# Patient Record
Sex: Male | Born: 1975 | Race: Black or African American | Hispanic: No | Marital: Single | Smoking: Former smoker
Health system: Southern US, Community
[De-identification: ages and names within clinical notes are randomized; demographics above are authoritative.]

## PROBLEM LIST (undated history)

## (undated) DIAGNOSIS — I10 Essential (primary) hypertension: Secondary | ICD-10-CM

---

## 1998-10-01 ENCOUNTER — Emergency Department (HOSPITAL_COMMUNITY): Admission: EM | Admit: 1998-10-01 | Discharge: 1998-10-01 | Payer: Self-pay | Admitting: Internal Medicine

## 1998-12-09 ENCOUNTER — Emergency Department (HOSPITAL_COMMUNITY): Admission: EM | Admit: 1998-12-09 | Discharge: 1998-12-09 | Payer: Self-pay | Admitting: *Deleted

## 1999-11-12 ENCOUNTER — Emergency Department (HOSPITAL_COMMUNITY): Admission: EM | Admit: 1999-11-12 | Discharge: 1999-11-12 | Payer: Self-pay | Admitting: Emergency Medicine

## 1999-11-23 ENCOUNTER — Encounter: Payer: Self-pay | Admitting: Emergency Medicine

## 1999-11-23 ENCOUNTER — Emergency Department (HOSPITAL_COMMUNITY): Admission: EM | Admit: 1999-11-23 | Discharge: 1999-11-23 | Payer: Self-pay | Admitting: Emergency Medicine

## 2001-01-10 ENCOUNTER — Emergency Department (HOSPITAL_COMMUNITY): Admission: EM | Admit: 2001-01-10 | Discharge: 2001-01-10 | Payer: Self-pay | Admitting: Emergency Medicine

## 2002-01-15 ENCOUNTER — Emergency Department (HOSPITAL_COMMUNITY): Admission: EM | Admit: 2002-01-15 | Discharge: 2002-01-15 | Payer: Self-pay | Admitting: Emergency Medicine

## 2002-07-13 ENCOUNTER — Emergency Department (HOSPITAL_COMMUNITY): Admission: EM | Admit: 2002-07-13 | Discharge: 2002-07-13 | Payer: Self-pay | Admitting: *Deleted

## 2002-07-13 ENCOUNTER — Encounter: Payer: Self-pay | Admitting: *Deleted

## 2003-01-20 ENCOUNTER — Emergency Department (HOSPITAL_COMMUNITY): Admission: EM | Admit: 2003-01-20 | Discharge: 2003-01-20 | Payer: Self-pay | Admitting: Emergency Medicine

## 2004-01-03 ENCOUNTER — Emergency Department (HOSPITAL_COMMUNITY): Admission: EM | Admit: 2004-01-03 | Discharge: 2004-01-03 | Payer: Self-pay | Admitting: Family Medicine

## 2004-07-30 ENCOUNTER — Emergency Department (HOSPITAL_COMMUNITY): Admission: EM | Admit: 2004-07-30 | Discharge: 2004-07-30 | Payer: Self-pay | Admitting: Emergency Medicine

## 2004-08-17 ENCOUNTER — Emergency Department (HOSPITAL_COMMUNITY): Admission: EM | Admit: 2004-08-17 | Discharge: 2004-08-17 | Payer: Self-pay | Admitting: Emergency Medicine

## 2005-07-16 ENCOUNTER — Emergency Department (HOSPITAL_COMMUNITY): Admission: EM | Admit: 2005-07-16 | Discharge: 2005-07-16 | Payer: Self-pay | Admitting: Family Medicine

## 2005-08-10 ENCOUNTER — Emergency Department (HOSPITAL_COMMUNITY): Admission: EM | Admit: 2005-08-10 | Discharge: 2005-08-10 | Payer: Self-pay | Admitting: Family Medicine

## 2009-06-01 ENCOUNTER — Emergency Department (HOSPITAL_COMMUNITY): Admission: EM | Admit: 2009-06-01 | Discharge: 2009-06-01 | Payer: Self-pay | Admitting: Emergency Medicine

## 2010-01-13 ENCOUNTER — Emergency Department (HOSPITAL_COMMUNITY)
Admission: EM | Admit: 2010-01-13 | Discharge: 2010-01-13 | Payer: Self-pay | Source: Home / Self Care | Admitting: Family Medicine

## 2010-04-14 LAB — URINALYSIS, ROUTINE W REFLEX MICROSCOPIC
Bilirubin Urine: NEGATIVE
Glucose, UA: NEGATIVE mg/dL
Hgb urine dipstick: NEGATIVE
Ketones, ur: NEGATIVE mg/dL
Nitrite: NEGATIVE
Protein, ur: NEGATIVE mg/dL
Specific Gravity, Urine: 1.026 (ref 1.005–1.030)
Urobilinogen, UA: 0.2 mg/dL (ref 0.0–1.0)
pH: 6 (ref 5.0–8.0)

## 2010-04-14 LAB — URINE MICROSCOPIC-ADD ON

## 2011-11-08 ENCOUNTER — Encounter (HOSPITAL_COMMUNITY): Payer: Self-pay | Admitting: Emergency Medicine

## 2011-11-08 ENCOUNTER — Emergency Department (INDEPENDENT_AMBULATORY_CARE_PROVIDER_SITE_OTHER)
Admission: EM | Admit: 2011-11-08 | Discharge: 2011-11-08 | Disposition: A | Discharge status: Home / Self Care | Payer: Self-pay | Source: Home / Self Care | Attending: Emergency Medicine | Admitting: Emergency Medicine

## 2011-11-08 DIAGNOSIS — H6120 Impacted cerumen, unspecified ear: Secondary | ICD-10-CM

## 2011-11-08 DIAGNOSIS — N342 Other urethritis: Secondary | ICD-10-CM

## 2011-11-08 LAB — HIV ANTIBODY (ROUTINE TESTING W REFLEX): HIV: NONREACTIVE

## 2011-11-08 LAB — RPR: RPR Ser Ql: NONREACTIVE

## 2011-11-08 MED ORDER — LIDOCAINE HCL (PF) 1 % IJ SOLN
INTRAMUSCULAR | Status: AC
  Filled 2011-11-08: qty 5

## 2011-11-08 MED ORDER — AZITHROMYCIN 250 MG PO TABS
ORAL_TABLET | ORAL | Status: AC
  Filled 2011-11-08: qty 4

## 2011-11-08 MED ORDER — CEFTRIAXONE SODIUM 250 MG IJ SOLR
250.0000 mg | Freq: Once | INTRAMUSCULAR | Status: AC
  Administered 2011-11-08: 250 mg via INTRAMUSCULAR

## 2011-11-08 MED ORDER — AZITHROMYCIN 250 MG PO TABS
1000.0000 mg | ORAL_TABLET | Freq: Once | ORAL | Status: AC
  Administered 2011-11-08: 1000 mg via ORAL

## 2011-11-08 MED ORDER — CEFTRIAXONE SODIUM 1 G IJ SOLR
INTRAMUSCULAR | Status: AC
  Filled 2011-11-08: qty 10

## 2011-11-08 NOTE — ED Notes (Signed)
Pt c/o pain on right ear since January... [place]... Sx include: decrease hearing, constant ring, clogged up... Denies: fevers, vomiting, nausea, diarrhea... Would also like to be checked for STD... C/o penis irritation, occasional clear drainage... Denies: blood, urinary problems, pain

## 2011-11-08 NOTE — ED Provider Notes (Signed)
Chief Complaint  Patient presents with  . Otalgia    History of Present Illness:   Martin Sloan is a 36 year old male who presents with 2 problems, right ear congestion and urethral irritation.  He's had problems with his ear since January. He notes a congested feeling, ringing, stopped up feeling and sometimes some pain. This is worse when he gets water in the ear or when he went swimming in the ocean. He denies any drainage from the ear and has also had some cough, congestion, and rhinorrhea. He denies any history of allergies.  He also complains of a one-week history of irritation to the tip of the urethra and a clear discharge. He denies dysuria, lesions of the penis, or adenopathy. He's had no fever, chills, skin rash, or joint pain. He does have a history of Chlamydia in the past.  Review of Systems:  Other than noted above, the patient denies any of the following symptoms: Systemic:  No fevers, chills, sweats, weight loss or gain, fatigue, or tiredness. Eye:  No redness, pain, discharge, itching, blurred vision, or diplopia. ENT:  No headache, nasal congestion, sneezing, itching, epistaxis, ear pain, congestion, decreased hearing, ringing in ears, vertigo, or tinnitus.  No oral lesions, sore throat, pain on swallowing, or hoarseness. Neck:  No mass, tenderness or adenopathy. Lungs:  No coughing, wheezing, or shortness of breath. Skin:  No rash or itching.  PMFSH:  Past medical history, family history, social history, meds, and allergies were reviewed.  Physical Exam:   Vital signs:  BP 140/75  Pulse 72  Temp 98.4 F (36.9 C) (Oral)  Resp 18  SpO2 100% General:  Alert and oriented.  In no distress.  Skin warm and dry. Eye:  PERRL, full EOMs, lids and conjunctiva normal.   ENT:  There were large cerumen impactions in both ear canals.  Nasal mucosa not congested and without drainage.  Mucous membranes moist, no oral lesions, normal dentition, pharynx clear.  No cranial or facial pain to  palplation. Neck:  Supple, full ROM.  No adenopathy, tenderness or mass.  Thyroid normal. Lungs:  Breath sounds clear and equal bilaterally.  No wheezes, rales or rhonchi. Heart:  Rhythm regular, without extrasystoles.  No gallops or murmers. Skin:  Clear, warm and dry. Genital exam: Was completely normal. There was no urethral discharge. No lesions on the penis or scrotum. Testes are normal. No inguinal lymphadenopathy.  Other Labs Obtained at Urgent Care Center:  A urethral swab was obtained for GC and Chlamydia DNA probe. Also obtained were serologies for HIV and RPR.  Results are pending at this time and we will call about any positive results.  Course in Urgent Care Center:   His ears were irrigated clear. A large amount of cerumen was obtained from both ears. Thereafter the canals and TMs are normal and he was able to hear again.  He was given Rocephin 250 mg IM followed by azithromycin 1000 mg by mouth and tolerated this well without any immediate side effects.  Assessment:  The primary encounter diagnosis was Impacted cerumen. A diagnosis of Urethritis was also pertinent to this visit.  Plan:   1.  The following meds were prescribed:   New Prescriptions   No medications on file   2.  The patient was instructed in symptomatic care and handouts were given. 3.  The patient was told to return if becoming worse in any way, if no better in 3 or 4 days, and given some red flag symptoms  that would indicate earlier return.     Reuben Likes, MD 11/08/11 [phone]

## 2011-11-09 LAB — GC/CHLAMYDIA PROBE AMP, GENITAL: GC Probe Amp, Genital: NEGATIVE

## 2011-11-12 ENCOUNTER — Emergency Department (HOSPITAL_COMMUNITY): Payer: Self-pay

## 2011-11-12 ENCOUNTER — Encounter (HOSPITAL_COMMUNITY): Payer: Self-pay | Admitting: *Deleted

## 2011-11-12 ENCOUNTER — Emergency Department (HOSPITAL_COMMUNITY)
Admission: EM | Admit: 2011-11-12 | Discharge: 2011-11-12 | Disposition: A | Discharge status: Home / Self Care | Payer: Self-pay | Attending: Emergency Medicine | Admitting: Emergency Medicine

## 2011-11-12 DIAGNOSIS — K5289 Other specified noninfective gastroenteritis and colitis: Secondary | ICD-10-CM | POA: Insufficient documentation

## 2011-11-12 DIAGNOSIS — R112 Nausea with vomiting, unspecified: Secondary | ICD-10-CM

## 2011-11-12 DIAGNOSIS — K529 Noninfective gastroenteritis and colitis, unspecified: Secondary | ICD-10-CM

## 2011-11-12 LAB — CBC WITH DIFFERENTIAL/PLATELET
Eosinophils Relative: 0 % (ref 0–5)
HCT: 43.3 % (ref 39.0–52.0)
Hemoglobin: 15.5 g/dL (ref 13.0–17.0)
Lymphocytes Relative: 12 % (ref 12–46)
Lymphs Abs: 1.9 10*3/uL (ref 0.7–4.0)
MCH: 28.2 pg (ref 26.0–34.0)
MCV: 78.7 fL (ref 78.0–100.0)
Monocytes Absolute: 0.8 10*3/uL (ref 0.1–1.0)
Monocytes Relative: 5 % (ref 3–12)
Platelets: 175 10*3/uL (ref 150–400)
RBC: 5.5 MIL/uL (ref 4.22–5.81)
WBC: 15.2 10*3/uL — ABNORMAL HIGH (ref 4.0–10.5)

## 2011-11-12 LAB — URINALYSIS, ROUTINE W REFLEX MICROSCOPIC
Glucose, UA: NEGATIVE mg/dL
Hgb urine dipstick: NEGATIVE
Ketones, ur: >80 mg/dL — AB
Leukocytes, UA: NEGATIVE
pH: 6.5 (ref 5.0–8.0)

## 2011-11-12 LAB — COMPREHENSIVE METABOLIC PANEL
AST: 62 U/L — ABNORMAL HIGH (ref 0–37)
Albumin: 5 g/dL (ref 3.5–5.2)
BUN: 14 mg/dL (ref 6–23)
Calcium: 10.6 mg/dL — ABNORMAL HIGH (ref 8.4–10.5)
Creatinine, Ser: 0.84 mg/dL (ref 0.50–1.35)
Total Protein: 8.4 g/dL — ABNORMAL HIGH (ref 6.0–8.3)

## 2011-11-12 LAB — RAPID URINE DRUG SCREEN, HOSP PERFORMED
Amphetamines: POSITIVE — AB
Benzodiazepines: NOT DETECTED
Tetrahydrocannabinol: POSITIVE — AB

## 2011-11-12 LAB — LIPASE, BLOOD: Lipase: 14 U/L (ref 11–59)

## 2011-11-12 MED ORDER — ONDANSETRON HCL 4 MG/2ML IJ SOLN
4.0000 mg | Freq: Once | INTRAMUSCULAR | Status: DC
  Filled 2011-11-12: qty 2

## 2011-11-12 MED ORDER — SODIUM CHLORIDE 0.9 % IV BOLUS (SEPSIS)
1000.0000 mL | Freq: Once | INTRAVENOUS | Status: AC
  Administered 2011-11-12: 1000 mL via INTRAVENOUS

## 2011-11-12 MED ORDER — ONDANSETRON 4 MG PO TBDP: 4.0000 mg | ORAL_TABLET | Freq: Three times a day (TID) | ORAL | Status: DC | PRN

## 2011-11-12 MED ORDER — IOHEXOL 300 MG/ML  SOLN
100.0000 mL | Freq: Once | INTRAMUSCULAR | Status: AC | PRN
  Administered 2011-11-12: 100 mL via INTRAVENOUS

## 2011-11-12 MED ORDER — PROMETHAZINE HCL 25 MG/ML IJ SOLN
25.0000 mg | INTRAMUSCULAR | Status: DC | PRN
  Administered 2011-11-12: 25 mg via INTRAVENOUS
  Filled 2011-11-12: qty 1

## 2011-11-12 NOTE — ED Notes (Addendum)
Per pt father, states pt took 40mg  of mother's prescription pantoprazole and drank an ensure prior to ED arrival

## 2011-11-12 NOTE — ED Notes (Signed)
Pt states nausea has eased up after taking phenergan. Pt denies pain at this time.

## 2011-11-12 NOTE — ED Notes (Signed)
D/C instructions reviewed. Rx for Zofran given. All questions answered.

## 2011-11-12 NOTE — ED Provider Notes (Signed)
History     CSN: [id number]  Arrival date & time 11/12/11  1550   First MD Initiated Contact with Patient 11/12/11 1630      Chief Complaint  Patient presents with  . Nausea     HPI Pt comes in from home, states that he had drank Ensure and felt nauseous and stated that he vomited 4-5 times since 12 noon. Pt also c/o dizziness and states that he just feels weird.  History reviewed. No pertinent past medical history.  History reviewed. No pertinent past surgical history.  History reviewed. No pertinent family history.  History  Substance Use Topics  . Smoking status: Never Smoker   . Smokeless tobacco: Not on file  . Alcohol Use: Yes      Review of Systems All other review of systems are negative Allergies  Review of patient's allergies indicates no known allergies.  Home Medications   Current Outpatient Rx  Name Route Sig Dispense Refill  . IBUPROFEN 200 MG PO TABS Oral Take 400 mg by mouth every 6 (six) hours as needed. Pain      BP 151/80  Pulse 84  Temp 97.9 F (36.6 C) (Oral)  Resp 16  SpO2 100%  Physical Exam  Nursing note and vitals reviewed. Constitutional: He is oriented to person, place, and time. He appears well-developed and well-nourished. No distress.  HENT:  Head: Normocephalic and atraumatic.  Eyes: Pupils are equal, round, and reactive to light.  Neck: Normal range of motion.  Cardiovascular: Normal rate and intact distal pulses.   Pulmonary/Chest: No respiratory distress.  Abdominal: Normal appearance. He exhibits no distension. There is no tenderness. There is no rebound and no guarding.  Musculoskeletal: Normal range of motion.  Neurological: He is alert and oriented to person, place, and time. No cranial nerve deficit.  Skin: Skin is warm and dry. No rash noted.  Psychiatric: He has a normal mood and affect. His behavior is normal.    ED Course  Procedures (including critical care time)  Medications  ibuprofen (ADVIL,MOTRIN)  200 MG tablet (not administered)  promethazine (PHENERGAN) injection 25 mg (25 mg Intravenous Given 11/12/11 1650)  sodium chloride 0.9 % bolus 1,000 mL (1000 mL Intravenous New Bag/Given 11/12/11 1647)  iohexol (OMNIPAQUE) 300 MG/ML solution 100 mL (100 mL Intravenous Contrast Given 11/12/11 1754)    Labs Reviewed  COMPREHENSIVE METABOLIC PANEL - Abnormal; Notable for the following:    Potassium 3.4 (*)     Chloride 93 (*)     CO2 16 (*)     Calcium 10.6 (*)     Total Protein 8.4 (*)     AST 62 (*)     ALT 66 (*)     All other components within normal limits  CBC WITH DIFFERENTIAL - Abnormal; Notable for the following:    WBC 15.2 (*)     Neutrophils Relative 83 (*)     Neutro Abs 12.6 (*)     All other components within normal limits  URINALYSIS, ROUTINE W REFLEX MICROSCOPIC - Abnormal; Notable for the following:    APPearance CLOUDY (*)     Ketones, ur >80 (*)     All other components within normal limits  URINE RAPID DRUG SCREEN (HOSP PERFORMED) - Abnormal; Notable for the following:    Amphetamines POSITIVE (*)     Tetrahydrocannabinol POSITIVE (*)     All other components within normal limits  LIPASE, BLOOD   Ct Abdomen Pelvis W Contrast  11/12/2011  *  RADIOLOGY REPORT*  Clinical Data: Pain  CT ABDOMEN AND PELVIS WITH CONTRAST  Technique:  Multidetector CT imaging of the abdomen and pelvis was performed following the standard protocol during bolus administration of intravenous contrast.  Contrast: OMNIPAQUE IOHEXOL 300 MG/ML  SOLN  Comparison: None.  Findings:  Lung bases are clear.  No pericardial or pleural effusion.  There is no focal liver abnormality.  The gallbladder appears normal.  No biliary dilatation.  The pancreas is unremarkable.  The spleen is negative.  Both adrenal glands are normal.  The right kidney is normal.  The left kidney is normal.  Urinary bladder is unremarkable.  Prostate gland and seminal vesicles are unremarkable.  There are no enlarged lymph  nodes within the upper abdomen.  No pelvic or inguinal adenopathy identified.  There is no free fluid or abnormal fluid collections identified within the abdomen or pelvis.  The stomach and the small bowel loops appear normal.  Normal appearance of the colon.   There is prominence of the colonic wall vertically the left colon and sigmoid colon.  This likely reflects incomplete distention.  No secondary inflammatory changes noted. No abnormal areas of enhancement noted.  Review of the visualized osseous structures is unremarkable.  IMPRESSION:  1.  There is mild wall thickening involving the colon which likely reflects incomplete distention.  Correlate for any clinical signs or symptoms of colitis. 2.  No evidence for bowel obstruction nor other inflammatory changes within the abdomen or pelvis.   Original Report Authenticated By: Rosealee Albee, M.D.      1. Nausea and vomiting   2. Colitis       MDM  After treatment in the ED the patient feels back to baseline and wants to go home.        Nelia Shi, MD 11/12/11 [phone]

## 2011-11-12 NOTE — ED Notes (Signed)
Pt given Ginger Ale for PO challenge. 

## 2011-11-12 NOTE — ED Notes (Signed)
Pt comes in from home, states that he had drank Ensure and felt nauseous and stated that he vomited 4-5 times since 12 noon. Pt also c/o dizziness and states that he just feels weird.

## 2014-06-14 IMAGING — CT CT ABD-PELV W/ CM
1 of 2 series · 15 of 32 positions shown, 19 images · IV contrast (omnipaque)
Comparison: None.

CLINICAL DATA: Pain

CT ABDOMEN AND PELVIS WITH CONTRAST
TECHNIQUE: Multidetector CT imaging of the abdomen and pelvis was
performed following the standard protocol during bolus
administration of intravenous contrast.
Contrast: 100mL OMNIPAQUE IOHEXOL 300 MG/ML  SOLN

[Series 2: abd/pel with · axial · 0.77mm/px · z∈[+1160,+1565]mm · 15 of 91 slices shown, 19 images]
[im 5/91  soft-tissue]
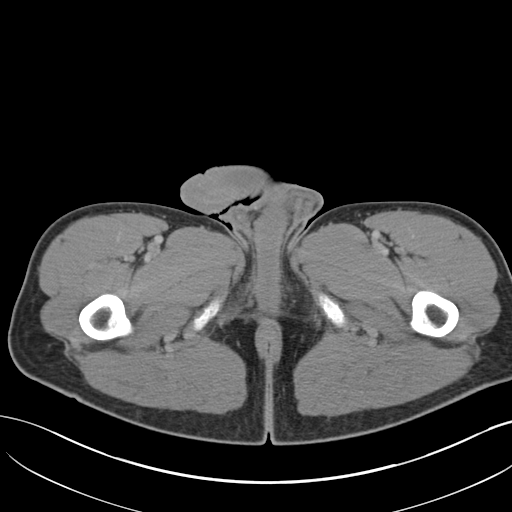
[im 5/91  bone]
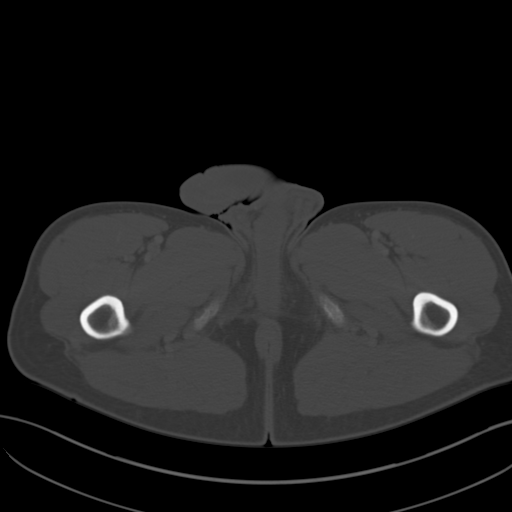
[im 13/91  soft-tissue]
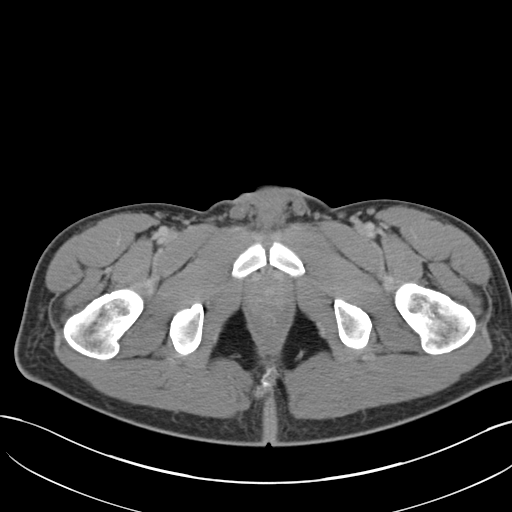
[im 21/91  soft-tissue]
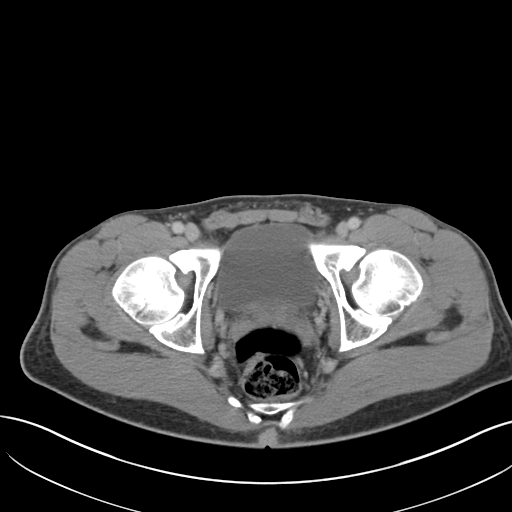
[im 25/91  soft-tissue]
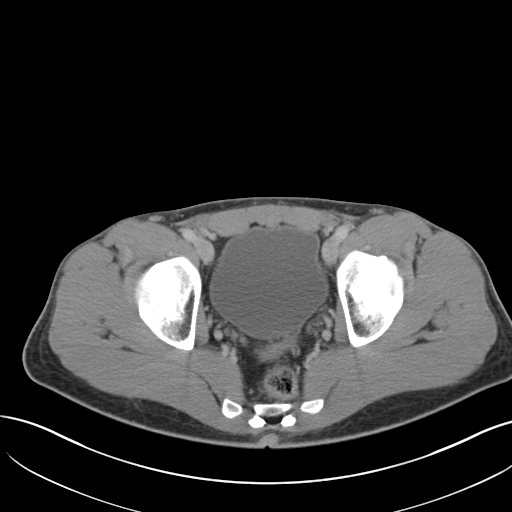
[im 33/91  soft-tissue]
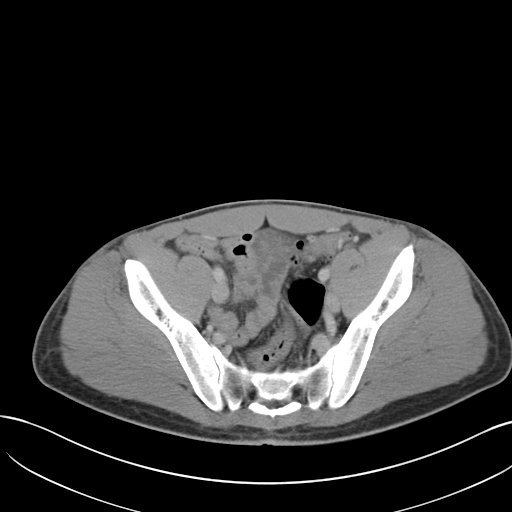
[im 37/91  soft-tissue]
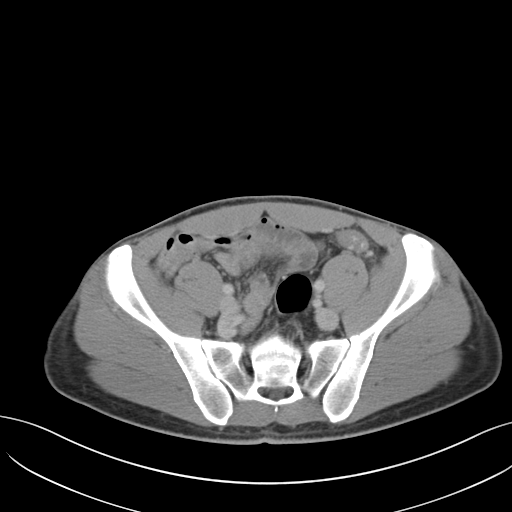
[im 46/91  soft-tissue]
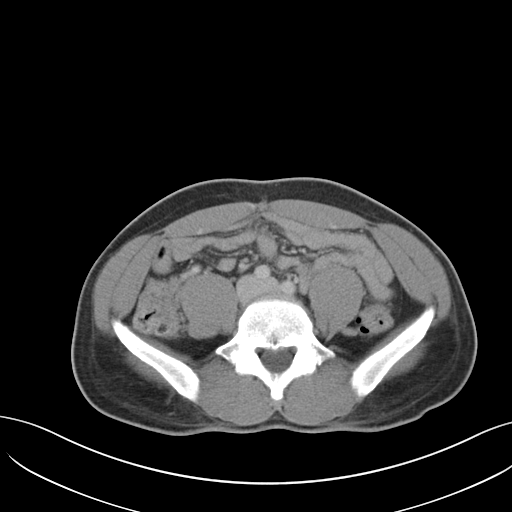
[im 54/91  soft-tissue]
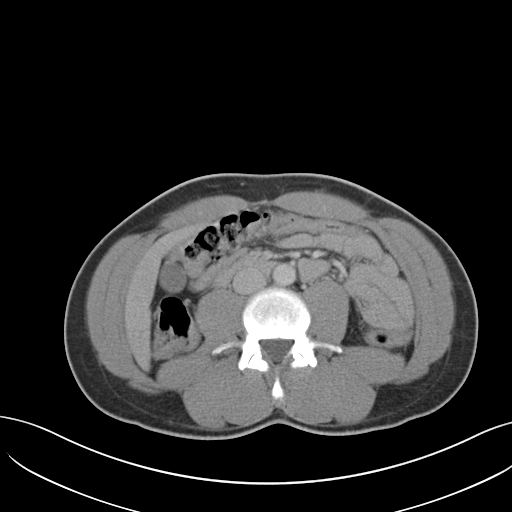
[im 58/91  soft-tissue]
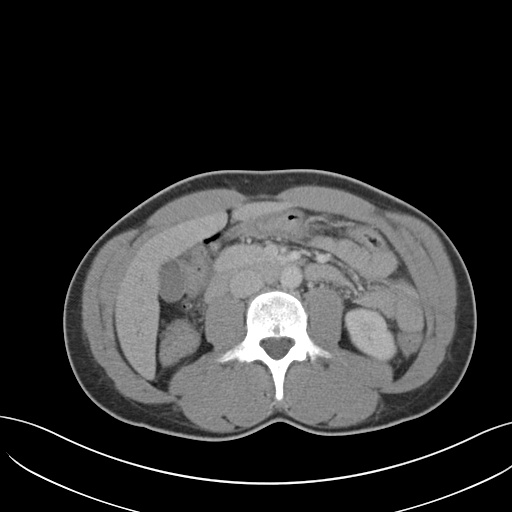
[im 58/91  bone]
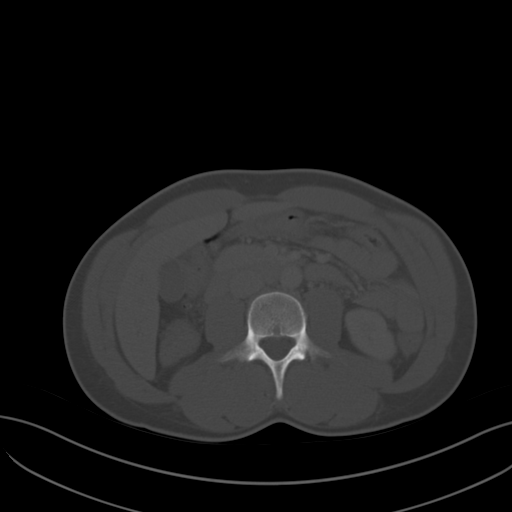
[im 66/91  soft-tissue]
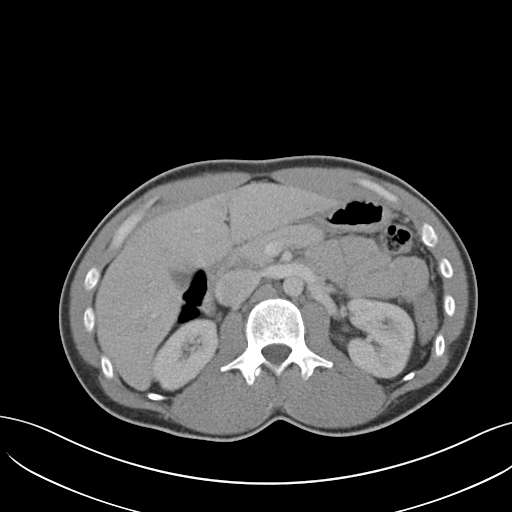
[im 70/91  soft-tissue]
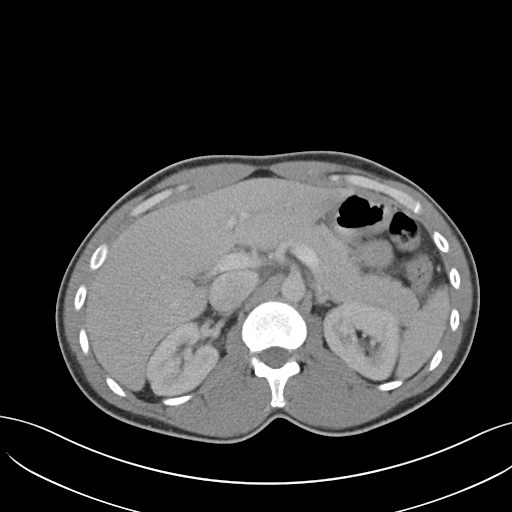
[im 74/91  lung]
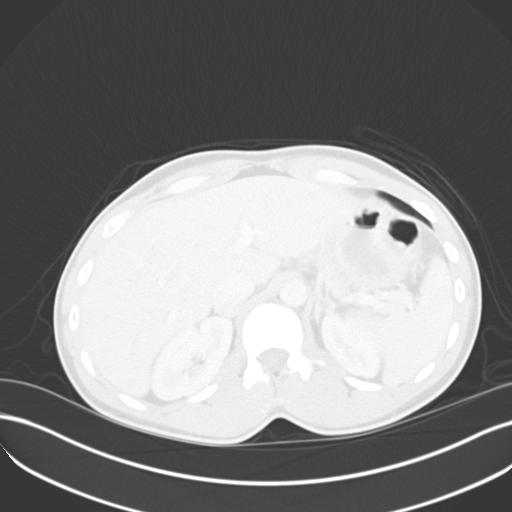
[im 78/91  soft-tissue]
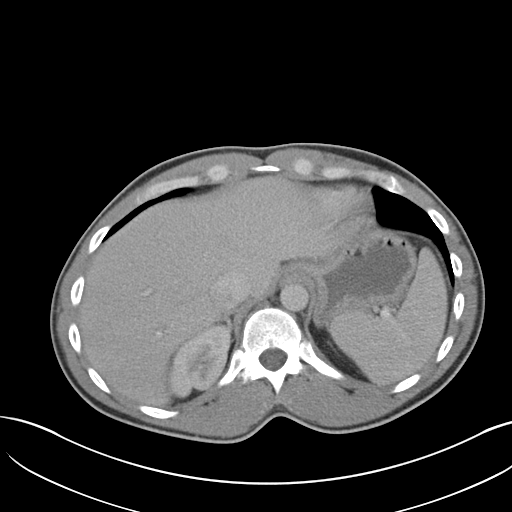
[im 78/91  lung]
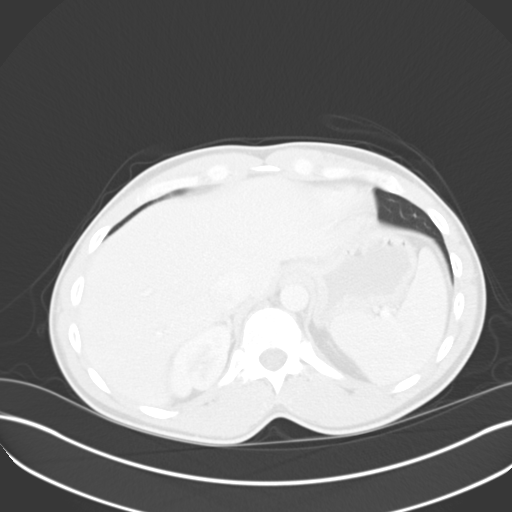
[im 82/91  lung]
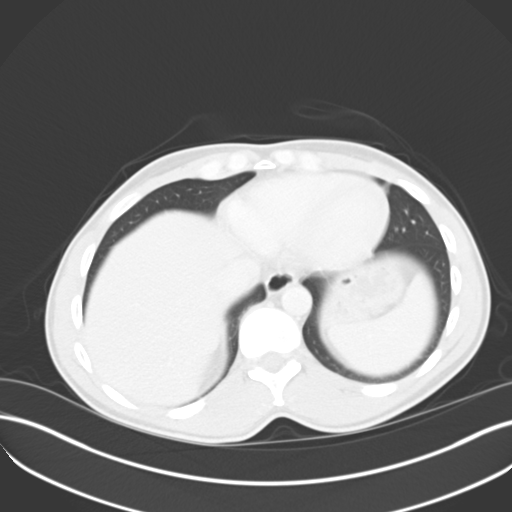
[im 86/91  soft-tissue]
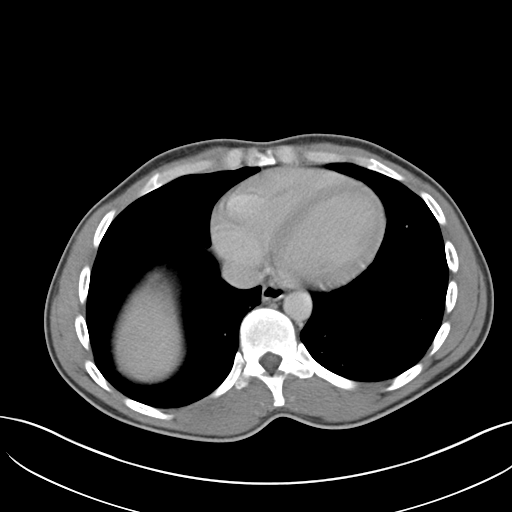
[im 86/91  lung]
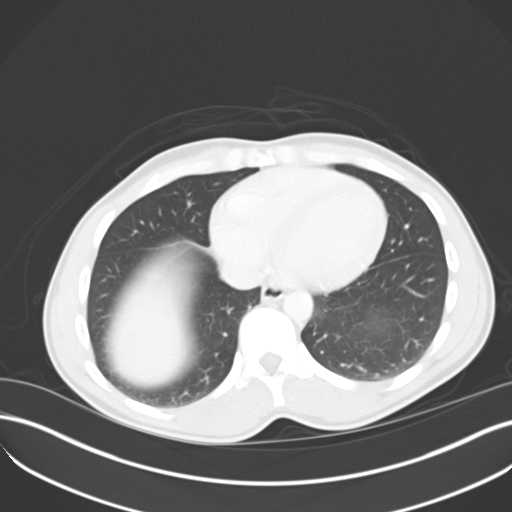

[15 of 32 positions shown; findings below may reference images not displayed]

FINDINGS: Lung bases are clear.  No pericardial or pleural effusion.  There
is no focal liver abnormality.  The gallbladder appears normal.  No
biliary dilatation.  The pancreas is unremarkable.

The spleen is negative.

Both adrenal glands are normal.  The right kidney is normal.  The
left kidney is normal.  Urinary bladder is unremarkable.  Prostate
gland and seminal vesicles are unremarkable.

There are no enlarged lymph nodes within the upper abdomen.  No
pelvic or inguinal adenopathy identified.  There is no free fluid
or abnormal fluid collections identified within the abdomen or
pelvis.

The stomach and the small bowel loops appear normal.  Normal
appearance of the colon.   There is prominence of the colonic wall
vertically the left colon and sigmoid colon.  This likely reflects
incomplete distention.  No secondary inflammatory changes noted.
No abnormal areas of enhancement noted.

Review of the visualized osseous structures is unremarkable.
IMPRESSION: 1.  There is mild wall thickening involving the colon which likely
reflects incomplete distention.  Correlate for any clinical signs
or symptoms of colitis.
2.  No evidence for bowel obstruction nor other inflammatory
changes within the abdomen or pelvis.

## 2015-03-20 ENCOUNTER — Other Ambulatory Visit (HOSPITAL_COMMUNITY)
Admission: RE | Admit: 2015-03-20 | Discharge: 2015-03-20 | Disposition: A | Discharge status: Home / Self Care | Payer: Medicaid Other | Source: Ambulatory Visit | Attending: Family Medicine | Admitting: Family Medicine

## 2015-03-20 ENCOUNTER — Emergency Department (HOSPITAL_COMMUNITY)
Admission: EM | Admit: 2015-03-20 | Discharge: 2015-03-20 | Disposition: A | Discharge status: Home / Self Care | Payer: Medicaid Other | Source: Home / Self Care | Attending: Family Medicine | Admitting: Family Medicine

## 2015-03-20 ENCOUNTER — Encounter (HOSPITAL_COMMUNITY): Payer: Self-pay | Admitting: *Deleted

## 2015-03-20 DIAGNOSIS — A64 Unspecified sexually transmitted disease: Secondary | ICD-10-CM | POA: Diagnosis not present

## 2015-03-20 DIAGNOSIS — Z113 Encounter for screening for infections with a predominantly sexual mode of transmission: Secondary | ICD-10-CM | POA: Insufficient documentation

## 2015-03-20 MED ORDER — AZITHROMYCIN 250 MG PO TABS
1000.0000 mg | ORAL_TABLET | Freq: Once | ORAL | Status: AC
  Administered 2015-03-20: 1000 mg via ORAL

## 2015-03-20 MED ORDER — CEFTRIAXONE SODIUM 250 MG IJ SOLR
250.0000 mg | Freq: Once | INTRAMUSCULAR | Status: AC
  Administered 2015-03-20: 250 mg via INTRAMUSCULAR

## 2015-03-20 MED ORDER — CEFTRIAXONE SODIUM 1 G IJ SOLR
INTRAMUSCULAR | Status: AC
  Filled 2015-03-20: qty 10

## 2015-03-20 MED ORDER — AZITHROMYCIN 250 MG PO TABS
ORAL_TABLET | ORAL | Status: AC
  Filled 2015-03-20: qty 4

## 2015-03-20 NOTE — ED Provider Notes (Signed)
CSN: [id number]     Arrival date & time 03/20/15  1912 History   First MD Initiated Contact with Patient 03/20/15 1954     Chief Complaint  Patient presents with  . Penile Discharge   (Consider location/radiation/quality/duration/timing/severity/associated sxs/prior Treatment) Patient is a 40 y.o. male presenting with penile discharge. The history is provided by the patient.  Penile Discharge This is a new problem. The current episode started yesterday (unprotected sex 6days ago , d/c noted yest). The problem has been gradually worsening. Associated symptoms comments: Penile d/c and soreness.Marland Kitchen    History reviewed. No pertinent past medical history. History reviewed. No pertinent past surgical history. History reviewed. No pertinent family history. Social History  Substance Use Topics  . Smoking status: Never Smoker   . Smokeless tobacco: None  . Alcohol Use: Yes    Review of Systems  Constitutional: Negative.   Genitourinary: Positive for discharge and penile pain. Negative for dysuria, flank pain, penile swelling, scrotal swelling, genital sores and testicular pain.  All other systems reviewed and are negative.   Allergies  Review of patient's allergies indicates no known allergies.  Home Medications   Prior to Admission medications   Medication Sig Start Date End Date Taking? Authorizing Provider  ibuprofen (ADVIL,MOTRIN) 200 MG tablet Take 400 mg by mouth every 6 (six) hours as needed. Pain    Historical Provider, MD  ondansetron (ZOFRAN ODT) 4 MG disintegrating tablet Take 1 tablet (4 mg total) by mouth every 8 (eight) hours as needed for nausea. 11/12/11   Nelva Nay, MD   Meds Ordered and Administered this Visit   Medications  cefTRIAXone (ROCEPHIN) injection 250 mg (not administered)  azithromycin (ZITHROMAX) tablet 1,000 mg (not administered)    BP 149/98 mmHg  Pulse 83  Temp(Src) 98.7 F (37.1 C) (Oral)  Resp 16  SpO2 98% No data found.   Physical  Exam  Constitutional: He is oriented to person, place, and time. He appears well-developed and well-nourished. No distress.  Abdominal: Soft. Bowel sounds are normal.  Genitourinary: Testes normal. Right testis shows no tenderness. Left testis shows no tenderness. Circumcised. Penile tenderness present. Discharge found.  Lymphadenopathy:       Right: No inguinal adenopathy present.       Left: No inguinal adenopathy present.  Neurological: He is alert and oriented to person, place, and time.  Skin: Skin is warm and dry.  Nursing note and vitals reviewed.   ED Course  Procedures (including critical care time)  Labs Review Labs Reviewed  CYTOLOGY, (ORAL, ANAL, URETHRAL) ANCILLARY ONLY    Imaging Review No results found.   Visual Acuity Review  Right Eye Distance:   Left Eye Distance:   Bilateral Distance:    Right Eye Near:   Left Eye Near:    Bilateral Near:         MDM   1. STD (male)     Meds ordered this encounter  Medications  . cefTRIAXone (ROCEPHIN) injection 250 mg    Sig:   . azithromycin (ZITHROMAX) tablet 1,000 mg    Sig:       Linna Hoff, MD 03/20/15 2024

## 2015-03-20 NOTE — Discharge Instructions (Signed)
We will call with positive test results and treat as indicated  °

## 2015-03-20 NOTE — ED Notes (Signed)
Pt  Reports  Symptoms  Of  Penile   Discharge      With  Discomfort     Pt   Reports   The  Symptoms  X  2  Days

## 2015-03-21 LAB — CYTOLOGY, (ORAL, ANAL, URETHRAL) ANCILLARY ONLY
Chlamydia: NEGATIVE
Neisseria Gonorrhea: POSITIVE — AB

## 2015-03-24 ENCOUNTER — Telehealth (HOSPITAL_COMMUNITY): Payer: Self-pay | Admitting: Emergency Medicine

## 2015-03-24 NOTE — ED Notes (Signed)
Pt called back... notified of recent lab results from visit 2/23 Pt ID'd properly... Reports feeling better and sx have subsided  Per Dr. Dayton Scrape,  Please let patient know that test for gonorrhea was positive. Treated at [facility] visit 03/20/15 with rocephin 250 mg IM and zithromax po 1g. No further treatment needed; recheck for persistent symptoms.   Please notify patient and health department. LM  Adv pt if sx are not getting better to return  Education on safe sex given Pt verb understanding.

## 2015-03-24 NOTE — ED Notes (Signed)
x1 Attempt  Unable to LM due to VM not being set up Need to give lab results from recent visit on 2/23  Per Dr. Dayton Scrape,  Please let patient know that test for gonorrhea was positive. Treated at [facility] visit 03/20/15 with rocephin 250 mg IM and zithromax po 1g. No further treatment needed; recheck for persistent symptoms.   Please notify patient and health department. LM   Will try later.  Faxed documentation to [facility]

## 2017-02-05 ENCOUNTER — Other Ambulatory Visit: Payer: Self-pay

## 2017-02-05 ENCOUNTER — Encounter (HOSPITAL_COMMUNITY): Payer: Self-pay | Admitting: *Deleted

## 2017-02-05 ENCOUNTER — Ambulatory Visit (HOSPITAL_COMMUNITY)
Admission: EM | Admit: 2017-02-05 | Discharge: 2017-02-05 | Disposition: A | Discharge status: Home / Self Care | Payer: Self-pay | Attending: Family Medicine | Admitting: Family Medicine

## 2017-02-05 DIAGNOSIS — R369 Urethral discharge, unspecified: Secondary | ICD-10-CM

## 2017-02-05 DIAGNOSIS — Z113 Encounter for screening for infections with a predominantly sexual mode of transmission: Secondary | ICD-10-CM

## 2017-02-05 DIAGNOSIS — Z202 Contact with and (suspected) exposure to infections with a predominantly sexual mode of transmission: Secondary | ICD-10-CM

## 2017-02-05 LAB — POCT URINALYSIS DIP (DEVICE)
BILIRUBIN URINE: NEGATIVE
GLUCOSE, UA: NEGATIVE mg/dL
Hgb urine dipstick: NEGATIVE
KETONES UR: NEGATIVE mg/dL
Leukocytes, UA: NEGATIVE
Nitrite: NEGATIVE
Protein, ur: NEGATIVE mg/dL
Specific Gravity, Urine: 1.025 (ref 1.005–1.030)
Urobilinogen, UA: 0.2 mg/dL (ref 0.0–1.0)
pH: 7 (ref 5.0–8.0)

## 2017-02-05 MED ORDER — AZITHROMYCIN 250 MG PO TABS
1000.0000 mg | ORAL_TABLET | Freq: Once | ORAL | Status: AC
Start: 2017-02-05 — End: 2017-02-05
  Administered 2017-02-05: 1000 mg via ORAL

## 2017-02-05 MED ORDER — LIDOCAINE HCL (PF) 1 % IJ SOLN
INTRAMUSCULAR | Status: AC
  Filled 2017-02-05: qty 2

## 2017-02-05 MED ORDER — AZITHROMYCIN 250 MG PO TABS
ORAL_TABLET | ORAL | Status: AC
  Filled 2017-02-05: qty 4

## 2017-02-05 MED ORDER — CEFTRIAXONE SODIUM 250 MG IJ SOLR
INTRAMUSCULAR | Status: AC
  Filled 2017-02-05: qty 250

## 2017-02-05 MED ORDER — CEFTRIAXONE SODIUM 250 MG IJ SOLR
250.0000 mg | Freq: Once | INTRAMUSCULAR | Status: AC
Start: 2017-02-05 — End: 2017-02-05
  Administered 2017-02-05: 250 mg via INTRAMUSCULAR

## 2017-02-05 NOTE — ED Triage Notes (Signed)
C/O penile discharge x 3 days.  Was told by partner she has chlamydia.

## 2017-02-05 NOTE — Discharge Instructions (Addendum)
We have treated you today for gonorrhea and chlamydia, with rocephin and azithromycin. Please refrain from sexual activity for 7 days while medicine is clearing infection. ° °We are testing you for Gonorrhea, Chlamydia and Trichomonas. We will call you if anything is positive and let you know if you require any further treatment. Please inform partner of any positive results. ° °Please return if symptoms not improving with treatment, development of fever, nausea, vomiting, abdominal pain, scrotal pain. °

## 2017-02-05 NOTE — ED Provider Notes (Signed)
[facility]    CSN: [id number] Arrival date & time: 02/05/17  1201     History   Chief Complaint Chief Complaint  Patient presents with  . Penile Discharge    HPI YEHONATAN GRANDISON is a 42 y.o. male presenting with penile discharge and concern over STD exposure. States his girlfriend cheated on him and tested positive for chlamydia. Symptoms occurring for a few days. Symptoms today include small amount of discharge and itching/irritation sensation on the inside of his urethra. Denies any pain, dysuria, rashes and lesions. Denies fever, nausea, vomiting, abdominal pain. Denies any scrotal/testicular swelling or pain.  HPI  History reviewed. No pertinent past medical history.  There are no active problems to display for this patient.   History reviewed. No pertinent surgical history.     Home Medications    Prior to Admission medications   Medication Sig Start Date End Date Taking? Authorizing Provider  ibuprofen (ADVIL,MOTRIN) 200 MG tablet Take 400 mg by mouth every 6 (six) hours as needed. Pain    [provider]  ondansetron (ZOFRAN ODT) 4 MG disintegrating tablet Take 1 tablet (4 mg total) by mouth every 8 (eight) hours as needed for nausea. 11/12/11   Nelva Nay, MD    Family History History reviewed. No pertinent family history.  Social History Social History   Tobacco Use  . Smoking status: Former Games developer  . Smokeless tobacco: Never Used  Substance Use Topics  . Alcohol use: No    Frequency: Never  . Drug use: No     Allergies   Patient has no known allergies.   Review of Systems Review of Systems  Constitutional: Negative for fever.  HENT: Negative for sore throat.   Respiratory: Negative for shortness of breath.   Cardiovascular: Negative for chest pain.  Gastrointestinal: Negative for abdominal pain, nausea and vomiting.  Genitourinary: Positive for discharge. Negative for difficulty urinating, dysuria, frequency, penile  pain, penile swelling, scrotal swelling and testicular pain.  Skin: Negative for rash.  Neurological: Negative for dizziness, light-headedness and headaches.     Physical Exam Triage Vital Signs ED Triage Vitals [02/05/17 1213]  Enc Vitals Group     BP (!) 145/83     Pulse Rate 66     Resp 14     Temp 98 F (36.7 C)     Temp Source Oral     SpO2 100 %     Weight      Height      Head Circumference      Peak Flow      Pain Score      Pain Loc      Pain Edu?      Excl. in GC?    No data found.  Updated Vital Signs BP (!) 145/83   Pulse 66   Temp 98 F (36.7 C) (Oral)   Resp 14   SpO2 100%    Physical Exam  Constitutional: He appears well-developed and well-nourished.  HENT:  Head: Normocephalic and atraumatic.  Eyes: Conjunctivae are normal.  Neck: Neck supple.  Cardiovascular: Normal rate and regular rhythm.  No murmur heard. Pulmonary/Chest: Effort normal and breath sounds normal. No respiratory distress.  Abdominal: Soft. There is no tenderness.  Genitourinary:  Genitourinary Comments: Exam deferred- patient denies rashes, lesions, pain  Musculoskeletal: He exhibits no edema.  Neurological: He is alert.  Skin: Skin is warm and dry.  Psychiatric: He has a normal mood and affect.  Nursing note  and vitals reviewed.    UC Treatments / Results  Labs (all labs ordered are listed, but only abnormal results are displayed) Labs Reviewed  URINE CULTURE  URINE CYTOLOGY ANCILLARY ONLY    EKG  EKG Interpretation None       Radiology No results found.  Procedures Procedures (including critical care time)  Medications Ordered in UC Medications - No data to display   Initial Impression / Assessment and Plan / UC Course  I have reviewed the triage vital signs and the nursing notes.  Pertinent labs & imaging results that were available during my care of the patient were reviewed by me and considered in my medical decision making (see chart for  details).     Patient with penile discharge. Screening for STD's performed, will call to inform of positive results. Patient requesting empiric treatment today. Treated with Rocephin and azithromycin.   Discussed strict return precautions. Patient verbalized understanding and is agreeable with plan.    Final Clinical Impressions(s) / UC Diagnoses   Final diagnoses:  None    ED Discharge Orders    None       Controlled Substance Prescriptions [place] Controlled Substance Registry consulted? Not Applicable   Lew DawesWieters, Hallie C, [place] 02/05/17 1232

## 2017-02-06 LAB — URINE CULTURE: Culture: NO GROWTH

## 2017-02-07 LAB — URINE CYTOLOGY ANCILLARY ONLY
Chlamydia: NEGATIVE
NEISSERIA GONORRHEA: NEGATIVE
Trichomonas: NEGATIVE

## 2018-06-29 ENCOUNTER — Encounter (HOSPITAL_COMMUNITY): Payer: Self-pay | Admitting: Emergency Medicine

## 2018-06-29 ENCOUNTER — Ambulatory Visit (HOSPITAL_COMMUNITY)
Admission: EM | Admit: 2018-06-29 | Discharge: 2018-06-29 | Disposition: A | Discharge status: Home / Self Care | Payer: Self-pay | Attending: Urgent Care | Admitting: Urgent Care

## 2018-06-29 ENCOUNTER — Other Ambulatory Visit: Payer: Self-pay

## 2018-06-29 DIAGNOSIS — R300 Dysuria: Secondary | ICD-10-CM | POA: Insufficient documentation

## 2018-06-29 DIAGNOSIS — R369 Urethral discharge, unspecified: Secondary | ICD-10-CM | POA: Insufficient documentation

## 2018-06-29 MED ORDER — AZITHROMYCIN 250 MG PO TABS
ORAL_TABLET | ORAL | Status: AC
  Filled 2018-06-29: qty 4

## 2018-06-29 MED ORDER — CEFTRIAXONE SODIUM 250 MG IJ SOLR
INTRAMUSCULAR | Status: AC
  Filled 2018-06-29: qty 250

## 2018-06-29 MED ORDER — CEFTRIAXONE SODIUM 250 MG IJ SOLR
250.0000 mg | Freq: Once | INTRAMUSCULAR | Status: AC
  Administered 2018-06-29: 16:00:00 250 mg via INTRAMUSCULAR

## 2018-06-29 MED ORDER — AZITHROMYCIN 250 MG PO TABS
1000.0000 mg | ORAL_TABLET | Freq: Once | ORAL | Status: AC
  Administered 2018-06-29: 1000 mg via ORAL

## 2018-06-29 NOTE — ED Triage Notes (Signed)
PT reports clear discharge and dysuria for 2 days.

## 2018-06-29 NOTE — ED Provider Notes (Signed)
  MRN: [id number] DOB: March 10, 1975  Subjective:   Martin Sloan is a 43 y.o. male presenting for 2-day history of clear penile discharge with mild to moderate persistent dysuria.  Patient had sex in the past week and unfortunately the condom broke.  He would like to get treated for STI and wants to have full panel.  No current facility-administered medications for this encounter.   Current Outpatient Medications:  .  ibuprofen (ADVIL,MOTRIN) 200 MG tablet, Take 400 mg by mouth every 6 (six) hours as needed. Pain, Disp: , Rfl:  .  ondansetron (ZOFRAN ODT) 4 MG disintegrating tablet, Take 1 tablet (4 mg total) by mouth every 8 (eight) hours as needed for nausea., Disp: 10 tablet, Rfl: 0   No Known Allergies  History reviewed. No pertinent past medical history.   History reviewed. No pertinent surgical history.  ROS  Objective:   Vitals: BP (!) 144/95 (BP Location: Left Arm)   Pulse 74   Temp 98.3 F (36.8 C) (Oral)   Resp 18   SpO2 96%   Physical Exam Constitutional:      Appearance: Normal appearance. He is well-developed and normal weight.  HENT:     Head: Normocephalic and atraumatic.     Right Ear: External ear normal.     Left Ear: External ear normal.     Nose: Nose normal.     Mouth/Throat:     Pharynx: Oropharynx is clear.  Eyes:     Extraocular Movements: Extraocular movements intact.     Pupils: Pupils are equal, round, and reactive to light.  Cardiovascular:     Rate and Rhythm: Normal rate.  Pulmonary:     Effort: Pulmonary effort is normal.  Neurological:     Mental Status: He is alert and oriented to person, place, and time.  Psychiatric:        Mood and Affect: Mood normal.        Behavior: Behavior normal.     Assessment and Plan :   Penile discharge  Dysuria  We will treat empirically per CDC guidelines for gonorrhea and chlamydia with IM ceftriaxone and azithromycin in clinic.  Counseled on safe sex practices. Counseled patient on potential  for adverse effects with medications prescribed/recommended today, ER and return-to-clinic precautions discussed, patient verbalized understanding.    Wallis Bamberg, [place] 06/29/18 1537

## 2018-06-30 LAB — URINE CYTOLOGY ANCILLARY ONLY
Chlamydia: NEGATIVE
Neisseria Gonorrhea: NEGATIVE
Trichomonas: NEGATIVE

## 2018-06-30 LAB — RPR: RPR Ser Ql: NONREACTIVE

## 2018-06-30 LAB — HIV ANTIBODY (ROUTINE TESTING W REFLEX): HIV Screen 4th Generation wRfx: NONREACTIVE

## 2018-06-30 LAB — URINE CULTURE: Culture: NO GROWTH

## 2019-05-05 ENCOUNTER — Ambulatory Visit: Payer: Medicaid Other | Attending: Internal Medicine

## 2019-05-05 DIAGNOSIS — Z23 Encounter for immunization: Secondary | ICD-10-CM

## 2019-05-05 NOTE — Progress Notes (Signed)
   Covid-19 Vaccination Clinic  Name:  [facility]    MRN: [id number] DOB: 1975/09/27  05/05/2019  Mr. Wence was observed post Covid-19 immunization for 15 minutes without incident. He was provided with Vaccine Information Sheet and instruction to access the V-Safe system.   Mr. Marquard was instructed to call 911 with any severe reactions post vaccine: Marland Kitchen Difficulty breathing  . Swelling of face and throat  . A fast heartbeat  . A bad rash all over body  . Dizziness and weakness   Immunizations Administered    Name Date Dose VIS Date Route   Moderna COVID-19 Vaccine 05/05/2019 12:21 PM 0.5 mL 12/26/2018 Intramuscular   Manufacturer: Moderna   Lot: [id number]   NDC: 96438-381-84

## 2019-06-02 ENCOUNTER — Ambulatory Visit: Payer: Medicaid Other | Attending: Internal Medicine

## 2019-06-02 DIAGNOSIS — Z23 Encounter for immunization: Secondary | ICD-10-CM

## 2019-06-02 NOTE — Progress Notes (Signed)
   Covid-19 Vaccination Clinic  Name:  BLAKELY GLUTH    MRN: [id number] DOB: [birth year]  06/02/2019  Mr. Hustead was observed post Covid-19 immunization for 15 minutes without incident. He was provided with Vaccine Information Sheet and instruction to access the V-Safe system.   Mr. Denker was instructed to call 911 with any severe reactions post vaccine: Marland Kitchen Difficulty breathing  . Swelling of face and throat  . A fast heartbeat  . A bad rash all over body  . Dizziness and weakness   Immunizations Administered    Name Date Dose VIS Date Route   Moderna COVID-19 Vaccine 06/02/2019 12:07 PM 0.5 mL 12/2018 Intramuscular   Manufacturer: Moderna   Lot: [id number]   NDC: 00459-977-41

## 2019-06-21 ENCOUNTER — Other Ambulatory Visit: Payer: Self-pay

## 2019-06-21 ENCOUNTER — Encounter (HOSPITAL_COMMUNITY): Payer: Self-pay | Admitting: Emergency Medicine

## 2019-06-21 ENCOUNTER — Ambulatory Visit (HOSPITAL_COMMUNITY)
Admission: EM | Admit: 2019-06-21 | Discharge: 2019-06-21 | Disposition: A | Discharge status: Home / Self Care | Payer: Medicaid Other | Attending: Physician Assistant | Admitting: Physician Assistant

## 2019-06-21 DIAGNOSIS — N342 Other urethritis: Secondary | ICD-10-CM | POA: Insufficient documentation

## 2019-06-21 MED ORDER — CEFTRIAXONE SODIUM 500 MG IJ SOLR
INTRAMUSCULAR | Status: AC
  Filled 2019-06-21: qty 500

## 2019-06-21 NOTE — ED Triage Notes (Signed)
Pt c/o penile discharge x 2 days ago and also c/o lower abd pain. Pt denies urinary symptoms or trouble with bowel movements. Patient denies any blood in urine. Pt states he was tested for STD before when he had similar symptoms and was negative is concerned and something else is wrong.

## 2019-06-21 NOTE — ED Provider Notes (Signed)
[facility]    CSN: [id number] Arrival date & time: 06/21/19  1105      History   Chief Complaint Chief Complaint  Patient presents with  . Abdominal Pain  . Penile Discharge    HPI Martin Sloan is a 44 y.o. male.   Patient reports for 2 days of penile discharge.  He also reports a tingling sensation that started a day prior to this.  He denies abdominal pain, testicular pain or swelling.  Denies fever or chills.  Denies significant painful urination.  Reports he had similar symptoms about a year ago however tested negative for disease.  Was treated with a shot and azithromycin at the time he says.  Reports symptoms resolved after that.  No intermittent symptoms since then.  Denies frequent urination or urgency.  Reports he is sexually active with one partner only.     History reviewed. No pertinent past medical history.  There are no problems to display for this patient.   History reviewed. No pertinent surgical history.     Home Medications    Prior to Admission medications   Medication Sig Start Date End Date Taking? Authorizing Provider  ibuprofen (ADVIL,MOTRIN) 200 MG tablet Take 400 mg by mouth every 6 (six) hours as needed. Pain    [provider]  ondansetron (ZOFRAN ODT) 4 MG disintegrating tablet Take 1 tablet (4 mg total) by mouth every 8 (eight) hours as needed for nausea. 11/12/11   Leonard Schwartz, MD    Family History History reviewed. No pertinent family history.  Social History Social History   Tobacco Use  . Smoking status: Former Research scientist (life sciences)  . Smokeless tobacco: Never Used  Substance Use Topics  . Alcohol use: No  . Drug use: No     Allergies   Patient has no known allergies.   Review of Systems Review of Systems   Physical Exam Triage Vital Signs ED Triage Vitals  Enc Vitals Group     BP 06/21/19 1133 132/80     Pulse Rate 06/21/19 1133 77     Resp 06/21/19 1133 14     Temp 06/21/19 1133 98.6 F (37 C)   Temp Source 06/21/19 1133 Oral     SpO2 06/21/19 1133 98 %     Weight --      Height --      Head Circumference --      Peak Flow --      Pain Score 06/21/19 1135 0     Pain Loc --      Pain Edu? --      Excl. in [place]? --    No data found.  Updated Vital Signs BP 132/80 (BP Location: Left Arm)   Pulse 77   Temp 98.6 F (37 C) (Oral)   Resp 14   SpO2 98%   Visual Acuity Right Eye Distance:   Left Eye Distance:   Bilateral Distance:    Right Eye Near:   Left Eye Near:    Bilateral Near:     Physical Exam Vitals and nursing note reviewed.  Constitutional:      Appearance: He is well-developed. He is not ill-appearing.  HENT:     Head: Normocephalic and atraumatic.  Eyes:     Conjunctiva/sclera: Conjunctivae normal.  Cardiovascular:     Rate and Rhythm: Normal rate and regular rhythm.     Heart sounds: No murmur.  Pulmonary:     Effort: Pulmonary effort is normal. No respiratory  distress.  Genitourinary:    Comments: No lesions in the genital region. No testicular swelling or pain Small amount of clear to white-colored discharge at the meatus. Musculoskeletal:     Cervical back: Neck supple.  Skin:    General: Skin is warm and dry.  Neurological:     Mental Status: He is alert.      UC Treatments / Results  Labs (all labs ordered are listed, but only abnormal results are displayed) Labs Reviewed  CYTOLOGY, (ORAL, ANAL, URETHRAL) ANCILLARY ONLY    EKG   Radiology No results found.  Procedures Procedures (including critical care time)  Medications Ordered in UC Medications - No data to display  Initial Impression / Assessment and Plan / UC Course  I have reviewed the triage vital signs and the nursing notes.  Pertinent labs & imaging results that were available during my care of the patient were reviewed by me and considered in my medical decision making (see chart for details).     #Urethritis Patient is a 44 year old symptoms consistent  with urethritis.  Patient was seen during downtime paper prescription for doxycycline sent.  Rocephin given in clinic.  Swab for gonorrhea, chlamydia, trichomonas obtained.  Discussed if all tests are negative again he should follow-up with urology for discussion of symptoms.  Patient verbalized understanding. Final Clinical Impressions(s) / UC Diagnoses   Final diagnoses:  Urethritis   Discharge Instructions   None    ED Prescriptions    None     PDMP not reviewed this encounter.   Hermelinda Medicus, PA-C 06/21/19 1425

## 2019-06-22 LAB — CYTOLOGY, (ORAL, ANAL, URETHRAL) ANCILLARY ONLY
Chlamydia: NEGATIVE
Comment: NEGATIVE
Comment: NEGATIVE
Comment: NORMAL
Neisseria Gonorrhea: NEGATIVE
Trichomonas: NEGATIVE

## 2020-06-05 ENCOUNTER — Encounter (HOSPITAL_COMMUNITY): Payer: Self-pay | Admitting: Emergency Medicine

## 2020-06-05 ENCOUNTER — Other Ambulatory Visit: Payer: Self-pay

## 2020-06-05 ENCOUNTER — Ambulatory Visit (HOSPITAL_COMMUNITY)
Admission: EM | Admit: 2020-06-05 | Discharge: 2020-06-05 | Disposition: A | Discharge status: Home / Self Care | Payer: Commercial Managed Care - PPO | Attending: Family | Admitting: Family

## 2020-06-05 DIAGNOSIS — R369 Urethral discharge, unspecified: Secondary | ICD-10-CM | POA: Diagnosis not present

## 2020-06-05 DIAGNOSIS — L293 Anogenital pruritus, unspecified: Secondary | ICD-10-CM

## 2020-06-05 DIAGNOSIS — N4889 Other specified disorders of penis: Secondary | ICD-10-CM | POA: Diagnosis not present

## 2020-06-05 MED ORDER — CEFTRIAXONE SODIUM 500 MG IJ SOLR
500.0000 mg | Freq: Once | INTRAMUSCULAR | Status: AC
  Administered 2020-06-05: 500 mg via INTRAMUSCULAR

## 2020-06-05 MED ORDER — DOXYCYCLINE HYCLATE 100 MG PO CAPS: 100.0000 mg | ORAL_CAPSULE | Freq: Two times a day (BID) | ORAL | 0 refills | Status: AC

## 2020-06-05 MED ORDER — LIDOCAINE HCL (PF) 1 % IJ SOLN
INTRAMUSCULAR | Status: AC
  Filled 2020-06-05: qty 2

## 2020-06-05 MED ORDER — CEFTRIAXONE SODIUM 500 MG IJ SOLR
INTRAMUSCULAR | Status: AC
  Filled 2020-06-05: qty 500

## 2020-06-05 NOTE — ED Provider Notes (Signed)
[facility]    CSN: [id number] Arrival date & time: 06/05/20  1201      History   Chief Complaint Chief Complaint  Patient presents with  . Penile Discharge    HPI Martin Sloan is a 45 y.o. male.   45 year old male presents with penile itching and irritation for the past 3 days. Then developed urethral white to yellow discharge today. Denies any fever, abdominal pain, back pain, dysuria, nausea, vomiting or testicular pain or swelling. No known exposure to STD. Has 1 current partner- does not use condoms. Has had similar symptoms in the past except for the discharge. Previous testing done May 2021 and June 2020 were negative for GC/Chlamydia/Trich but improved with Zithromax, Doxycycline and Rocephin. Wants to be treated for possible GC/Chlamydia today. No other chronic health issues. Takes no daily medicaiton.   The history is provided by the patient.    History reviewed. No pertinent past medical history.  There are no problems to display for this patient.   History reviewed. No pertinent surgical history.     Home Medications    Prior to Admission medications   Medication Sig Start Date End Date Taking? Authorizing Provider  doxycycline (VIBRAMYCIN) 100 MG capsule Take 1 capsule (100 mg total) by mouth 2 (two) times daily for 7 days. 06/05/20 06/12/20 Yes Mariyanna Mucha, Ali Lowe, NP  ibuprofen (ADVIL,MOTRIN) 200 MG tablet Take 400 mg by mouth every 6 (six) hours as needed. Pain    [provider]    Family History Family History  Problem Relation Age of Onset  . Healthy Mother   . Healthy Father     Social History Social History   Tobacco Use  . Smoking status: Former Games developer  . Smokeless tobacco: Never Used  Vaping Use  . Vaping Use: Never used  Substance Use Topics  . Alcohol use: No  . Drug use: No     Allergies   Patient has no known allergies.   Review of Systems Review of Systems  Constitutional: Negative for activity change,  appetite change, chills, fatigue and fever.  HENT: Negative for mouth sores, sore throat and trouble swallowing.   Respiratory: Negative for cough and shortness of breath.   Gastrointestinal: Negative for abdominal pain, diarrhea, nausea and vomiting.  Genitourinary: Positive for penile discharge. Negative for difficulty urinating, dysuria, flank pain, frequency, genital sores, hematuria, penile pain (but positive irritation), penile swelling, scrotal swelling, testicular pain and urgency.  Musculoskeletal: Negative for arthralgias, back pain and myalgias.  Skin: Negative for color change and rash.  Allergic/Immunologic: Negative for environmental allergies, food allergies and immunocompromised state.  Neurological: Negative for dizziness, seizures, syncope, weakness and headaches.  Hematological: Negative for adenopathy. Does not bruise/bleed easily.     Physical Exam Triage Vital Signs ED Triage Vitals  Enc Vitals Group     BP 06/05/20 1329 (!) 145/86     Pulse Rate 06/05/20 1329 75     Resp 06/05/20 1329 18     Temp 06/05/20 1329 98.9 F (37.2 C)     Temp Source 06/05/20 1329 Oral     SpO2 06/05/20 1329 100 %     Weight 06/05/20 1327 190 lb (86.2 kg)     Height 06/05/20 1327 5\' 10"  (1.778 m)     Head Circumference --      Peak Flow --      Pain Score 06/05/20 1327 0     Pain Loc --  Pain Edu? --      Excl. in [place]? --    No data found.  Updated Vital Signs BP (!) 145/86 (BP Location: Left Arm)   Pulse 75   Temp 98.9 F (37.2 C) (Oral)   Resp 18   Ht 5\' 10"  (1.778 m)   Wt 190 lb (86.2 kg)   SpO2 100%   BMI 27.26 kg/m   Visual Acuity Right Eye Distance:   Left Eye Distance:   Bilateral Distance:    Right Eye Near:   Left Eye Near:    Bilateral Near:     Physical Exam Vitals and nursing note reviewed. Chaperone present: patient declines chaperone.  Constitutional:      General: He is awake. He is not in acute distress.    Appearance: He is well-developed  and well-groomed.     Comments: He is sitting comfortably on the exam table in no acute distress.   HENT:     Head: Normocephalic and atraumatic.     Right Ear: Hearing normal.     Left Ear: Hearing normal.  Eyes:     Extraocular Movements: Extraocular movements intact.     Conjunctiva/sclera: Conjunctivae normal.  Cardiovascular:     Rate and Rhythm: Normal rate and regular rhythm.     Heart sounds: Normal heart sounds. No murmur heard.   Pulmonary:     Effort: Pulmonary effort is normal.     Breath sounds: Normal breath sounds.  Abdominal:     General: Abdomen is flat. Bowel sounds are normal. There is no distension.     Palpations: Abdomen is soft.     Tenderness: There is no abdominal tenderness. There is no right CVA tenderness, left CVA tenderness, guarding or rebound.  Genitourinary:    Pubic Area: No rash.      Penis: Circumcised. Erythema and discharge present. No phimosis, tenderness, swelling or lesions.      Testes: Normal.     Comments: Slight redness around urethral opening but no rash or lesions present. Slight whitish discharge present. No lymph node swelling. No testicular tenderness.  Musculoskeletal:     Cervical back: Normal range of motion.  Skin:    General: Skin is warm and dry.     Findings: No rash.  Neurological:     General: No focal deficit present.     Mental Status: He is alert and oriented to person, place, and time.  Psychiatric:        Mood and Affect: Mood normal.        Behavior: Behavior normal. Behavior is cooperative.        Thought Content: Thought content normal.        Judgment: Judgment normal.      UC Treatments / Results  Labs (all labs ordered are listed, but only abnormal results are displayed) Labs Reviewed  CYTOLOGY, (ORAL, ANAL, URETHRAL) ANCILLARY ONLY    EKG   Radiology No results found.  Procedures Procedures (including critical care time)  Medications Ordered in UC Medications  cefTRIAXone (ROCEPHIN)  injection 500 mg (500 mg Intramuscular Given 06/05/20 1503)    Initial Impression / Assessment and Plan / UC Course  I have reviewed the triage vital signs and the nursing notes.  Pertinent labs & imaging results that were available during my care of the patient were reviewed by me and considered in my medical decision making (see chart for details).    Reviewed with patient that he probably has urethritis- can be  bacterial and caused by STD but may have other causes. Since patient is unable to return if Gonorrhea is positive for Rocephin, will give Rocephin 500mg  IM now. Will start Doxycycline 100mg  twice a day for possible Chlamydia- will also cover other bacteria that may cause urethritis. Discussed that he may need to see a Urologist if symptoms continue and test results are negative. Continue to push fluids. No sexual intercourse for at least 7 days. Follow-up pending lab results.   Final Clinical Impressions(s) / UC Diagnoses   Final diagnoses:  Urethral discharge in male  Penile irritation  Itching of penis     Discharge Instructions     You were given a shot of Rocephin (antibiotic) today for possible Gonorrhea. Recommend start Doxycycline 100mg  twice a day for 7 days for possible Chlamydia or other bacterial infection. Continue to push fluids. Follow-up pending test results.     ED Prescriptions    Medication Sig Dispense Auth. Provider   doxycycline (VIBRAMYCIN) 100 MG capsule Take 1 capsule (100 mg total) by mouth 2 (two) times daily for 7 days. 14 capsule Kendle Turbin, , NP     PDMP not reviewed this encounter.   , NP 06/05/20 1944

## 2020-06-05 NOTE — ED Triage Notes (Signed)
Pt presents today with c/o of penile itching and discharge x 2 days. Denies dysuria or fever.

## 2020-06-05 NOTE — Discharge Instructions (Addendum)
You were given a shot of Rocephin (antibiotic) today for possible Gonorrhea. Recommend start Doxycycline 100mg  twice a day for 7 days for possible Chlamydia or other bacterial infection. Continue to push fluids. Follow-up pending test results.

## 2020-06-06 LAB — CYTOLOGY, (ORAL, ANAL, URETHRAL) ANCILLARY ONLY
Chlamydia: NEGATIVE
Comment: NEGATIVE
Comment: NEGATIVE
Comment: NORMAL
Neisseria Gonorrhea: NEGATIVE
Trichomonas: NEGATIVE

## 2020-06-25 ENCOUNTER — Other Ambulatory Visit: Payer: Self-pay

## 2020-06-25 ENCOUNTER — Encounter (HOSPITAL_COMMUNITY): Payer: Self-pay

## 2020-06-25 ENCOUNTER — Ambulatory Visit (HOSPITAL_COMMUNITY)
Admission: EM | Admit: 2020-06-25 | Discharge: 2020-06-25 | Disposition: A | Discharge status: Home / Self Care | Payer: Commercial Managed Care - PPO | Attending: Internal Medicine | Admitting: Internal Medicine

## 2020-06-25 DIAGNOSIS — Z87891 Personal history of nicotine dependence: Secondary | ICD-10-CM | POA: Insufficient documentation

## 2020-06-25 DIAGNOSIS — B349 Viral infection, unspecified: Secondary | ICD-10-CM | POA: Diagnosis not present

## 2020-06-25 DIAGNOSIS — R6883 Chills (without fever): Secondary | ICD-10-CM | POA: Insufficient documentation

## 2020-06-25 DIAGNOSIS — R519 Headache, unspecified: Secondary | ICD-10-CM | POA: Insufficient documentation

## 2020-06-25 DIAGNOSIS — R059 Cough, unspecified: Secondary | ICD-10-CM | POA: Insufficient documentation

## 2020-06-25 DIAGNOSIS — M5432 Sciatica, left side: Secondary | ICD-10-CM | POA: Diagnosis not present

## 2020-06-25 DIAGNOSIS — R079 Chest pain, unspecified: Secondary | ICD-10-CM | POA: Insufficient documentation

## 2020-06-25 DIAGNOSIS — M25552 Pain in left hip: Secondary | ICD-10-CM | POA: Diagnosis not present

## 2020-06-25 DIAGNOSIS — U071 COVID-19: Secondary | ICD-10-CM | POA: Diagnosis not present

## 2020-06-25 HISTORY — DX: Essential (primary) hypertension: I10

## 2020-06-25 MED ORDER — PROMETHAZINE-DM 6.25-15 MG/5ML PO SYRP: 5.0000 mL | ORAL_SOLUTION | Freq: Four times a day (QID) | ORAL | 0 refills | Status: DC | PRN

## 2020-06-25 MED ORDER — BENZONATATE 100 MG PO CAPS: 200.0000 mg | ORAL_CAPSULE | Freq: Three times a day (TID) | ORAL | 0 refills | Status: DC | PRN

## 2020-06-25 MED ORDER — NAPROXEN 500 MG PO TABS: 500.0000 mg | ORAL_TABLET | Freq: Two times a day (BID) | ORAL | 0 refills | Status: DC

## 2020-06-25 NOTE — ED Triage Notes (Signed)
Pt reports fever, cough, headache and body aches since last night and left side intermittent chest pain starting this morning which is now on both sides.  Pt also c/o left hip pain since Monday of last week. Pt states may be from driving over large bump while working.

## 2020-06-25 NOTE — ED Provider Notes (Signed)
[facility]    CSN: [id number] Arrival date & time: 06/25/20  1757      History   Chief Complaint Chief Complaint  Patient presents with  . Cough  . Chest Pain  . Fever  . Headache  . Generalized Body Aches  . Hip Pain    HPI Martin Sloan is a 45 y.o. male.   Patient here for evaluation of chest pain, cough, headache, fever, and chills that started yesterday.  Reports friend was recently sick but did not get evaluated or tested for any illness.  Has not tried any OTC medication or treatments.  Also reports having some left hip pain for the past week.  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  Denies any dysuria, urgency, or frequency.  Denies any loss of bowel or bladder control.  Denies any trauma, injury, or other precipitating event.  Denies any specific alleviating or aggravating factors.  Denies any N/V/D, numbness, tingling, weakness, abdominal pain.     The history is provided by the patient.  Cough Associated symptoms: chest pain, chills, fever and headaches   Chest Pain Associated symptoms: cough, fever and headache   Fever Associated symptoms: chest pain, chills, congestion, cough and headaches   Headache Associated symptoms: congestion, cough and fever   Hip Pain Associated symptoms include chest pain and headaches.    Past Medical History:  Diagnosis Date  . Hypertension     There are no problems to display for this patient.   History reviewed. No pertinent surgical history.     Home Medications    Prior to Admission medications   Medication Sig Start Date End Date Taking? Authorizing Provider  benzonatate (TESSALON PERLES) 100 MG capsule Take 2 capsules (200 mg total) by mouth 3 (three) times daily as needed for cough. 06/25/20  Yes Ivette Loyal, NP  naproxen (NAPROSYN) 500 MG tablet Take 1 tablet (500 mg total) by mouth 2 (two) times daily. 06/25/20   Yes Ivette Loyal, NP  omeprazole (PRILOSEC) 10 MG capsule Take 10 mg by mouth daily.   Yes [provider]  promethazine-dextromethorphan (PROMETHAZINE-DM) 6.25-15 MG/5ML syrup Take 5 mLs by mouth 4 (four) times daily as needed for cough. 06/25/20  Yes Ivette Loyal, NP  ibuprofen (ADVIL,MOTRIN) 200 MG tablet Take 400 mg by mouth every 6 (six) hours as needed. Pain    [provider]    Family History Family History  Problem Relation Age of Onset  . Healthy Mother   . Healthy Father     Social History Social History   Tobacco Use  . Smoking status: Former Games developer  . Smokeless tobacco: Never Used  Vaping Use  . Vaping Use: Never used  Substance Use Topics  . Alcohol use: No  . Drug use: No     Allergies   Patient has no known allergies.   Review of Systems Review of Systems  Constitutional: Positive for chills and fever.  HENT: Positive for congestion.   Respiratory: Positive for cough.   Cardiovascular: Positive for chest pain.  Musculoskeletal: Positive for arthralgias and joint swelling.  Neurological: Positive for headaches.  All other systems reviewed and are negative.    Physical Exam Triage Vital Signs ED Triage Vitals  Enc Vitals Group     BP 06/25/20 1820 (!) 151/98     Pulse Rate 06/25/20 1820 (!) 111  Resp 06/25/20 1820 (!) 25     Temp 06/25/20 1820 99.2 F (37.3 C)     Temp Source 06/25/20 1820 Oral     SpO2 06/25/20 1820 95 %     Weight --      Height --      Head Circumference --      Peak Flow --      Pain Score 06/25/20 1823 10     Pain Loc --      Pain Edu? --      Excl. in [place]? --    No data found.  Updated Vital Signs BP (!) 151/98   Pulse (!) 111   Temp 99.2 F (37.3 C) (Oral)   Resp (!) 25   SpO2 95%   Visual Acuity Right Eye Distance:   Left Eye Distance:   Bilateral Distance:    Right Eye Near:   Left Eye Near:    Bilateral Near:     Physical Exam Vitals and nursing note reviewed.   Constitutional:      General: He is not in acute distress.    Appearance: Normal appearance. He is not ill-appearing, toxic-appearing or diaphoretic.  HENT:     Head: Normocephalic and atraumatic.     Mouth/Throat:     Pharynx: Uvula midline. Posterior oropharyngeal erythema present. No pharyngeal swelling, oropharyngeal exudate or uvula swelling.     Tonsils: No tonsillar exudate or tonsillar abscesses. 0 on the right. 0 on the left.  Eyes:     Conjunctiva/sclera: Conjunctivae normal.  Cardiovascular:     Rate and Rhythm: Regular rhythm. Tachycardia present.     Pulses: Normal pulses.     Heart sounds: Normal heart sounds.  Pulmonary:     Effort: Pulmonary effort is normal.     Breath sounds: Normal breath sounds.  Chest:     Chest wall: No mass, deformity, tenderness, crepitus or edema. There is no dullness to percussion.  Abdominal:     General: Abdomen is flat.     Palpations: Abdomen is soft.  Musculoskeletal:     Cervical back: Normal, normal range of motion and neck supple.     Thoracic back: Normal.     Lumbar back: Tenderness present. No bony tenderness. Normal range of motion. Positive left straight leg raise test. Negative right straight leg raise test.  Skin:    General: Skin is warm and dry.  Neurological:     General: No focal deficit present.     Mental Status: He is alert and oriented to person, place, and time.     GCS: GCS eye subscore is 4. GCS verbal subscore is 5. GCS motor subscore is 6.     Cranial Nerves: No dysarthria or facial asymmetry.     Gait: Gait normal.  Psychiatric:        Mood and Affect: Mood normal.      UC Treatments / Results  Labs (all labs ordered are listed, but only abnormal results are displayed) Labs Reviewed  SARS CORONAVIRUS 2 (TAT 6-24 HRS)    EKG   Radiology No results found.  Procedures Procedures (including critical care time)  Medications Ordered in UC Medications - No data to display  Initial Impression  / Assessment and Plan / UC Course  I have reviewed the triage vital signs and the nursing notes.  Pertinent labs & imaging results that were available during my care of the patient were reviewed by me and considered in my medical decision  making (see chart for details).    Viral illness.  Assessment negative for red flags or concerns.  COVID test pending.  Discussed need to quarantine while waiting on results.  Work note supplied.  Tessalon and Promethazine DM as needed for cough.  Encouraged fluids and rest.  Discussed conservative symptom management as described in discharge instructions.   Sciatica of the left side: Assessment negative for red flags or concerns.  Naproxen twice a day for the next 5-7 days. Encouraged fluids and rest.  May use ice, heat, or alternate between heat and ice for comfort.  Patient given gentle stretches and exercises to help with pain.  Follow up with ortho or sports medicine if symptoms are not resolving.   Final Clinical Impressions(s) / UC Diagnoses   Final diagnoses:  Viral illness  Sciatica of left side     Discharge Instructions     For your viral illness:  We will contact you if your COVID test is positive.  Please quarantine while waiting on the results.  If your test is negative, you can resume normal activities.  If your test is positive, please continue to quarantine for at least 5 days form your symptom start or until you are without a fever for at least 24 hours.   You can take Tylenol and/or Ibuprofen as needed for fever reduction and pain relief.   Take the Tessalon perles as needed for cough. You can also take the Promethazine DM as needed for cough at night as it can make you sleepy.   For cough: honey 1/2 to 1 teaspoon (you can dilute the honey in water or another fluid).  You can also use guaifenesin and dextromethorphan for cough. You can use a humidifier for chest congestion and cough.  If you don't have a humidifier, you can sit in the  bathroom with the hot shower running.    For sore throat: try warm salt water gargles, cepacol lozenges, throat spray, warm tea or water with lemon/honey, popsicles or ice, or OTC cold relief medicine for throat discomfort.  For congestion: take a daily anti-histamine like Zyrtec, Claritin, and a oral decongestant, such as pseudoephedrine.  You can also use Flonase 1-2 sprays in each nostril daily.    It is important to stay hydrated: drink plenty of fluids (water, gatorade/powerade/pedialyte, juices, or teas) to keep your throat moisturized and help further relieve irritation/discomfort.   For your sciatica: take the naproxen twice a day for the next 5-7 days and then as needed for pain.    Rest and do the exercises in the hand out to help with pain.   Use heat, ice, or alternate heat and ice for comfort.  Follow up with ortho or sports medicine if your symptoms do not improve.   Return or go to the Emergency Department if symptoms worsen or do not improve in the next few days.      ED Prescriptions    Medication Sig Dispense Auth. Provider   naproxen (NAPROSYN) 500 MG tablet Take 1 tablet (500 mg total) by mouth 2 (two) times daily. 30 tablet Ivette LoyalSmith, Tywana Robotham R, NP   benzonatate (TESSALON PERLES) 100 MG capsule Take 2 capsules (200 mg total) by mouth 3 (three) times daily as needed for cough. 20 capsule Ivette LoyalSmith, Tresten Pantoja R, NP   promethazine-dextromethorphan (PROMETHAZINE-DM) 6.25-15 MG/5ML syrup Take 5 mLs by mouth 4 (four) times daily as needed for cough. 118 mL Ivette LoyalSmith, Daffney Greenly R, NP     PDMP not reviewed this  encounter.   Ivette Loyal, NP 06/25/20 1906

## 2020-06-25 NOTE — Discharge Instructions (Signed)
For your viral illness:  We will contact you if your COVID test is positive.  Please quarantine while waiting on the results.  If your test is negative, you can resume normal activities.  If your test is positive, please continue to quarantine for at least 5 days form your symptom start or until you are without a fever for at least 24 hours.   You can take Tylenol and/or Ibuprofen as needed for fever reduction and pain relief.   Take the Tessalon perles as needed for cough. You can also take the Promethazine DM as needed for cough at night as it can make you sleepy.   For cough: honey 1/2 to 1 teaspoon (you can dilute the honey in water or another fluid).  You can also use guaifenesin and dextromethorphan for cough. You can use a humidifier for chest congestion and cough.  If you don't have a humidifier, you can sit in the bathroom with the hot shower running.    For sore throat: try warm salt water gargles, cepacol lozenges, throat spray, warm tea or water with lemon/honey, popsicles or ice, or OTC cold relief medicine for throat discomfort.  For congestion: take a daily anti-histamine like Zyrtec, Claritin, and a oral decongestant, such as pseudoephedrine.  You can also use Flonase 1-2 sprays in each nostril daily.    It is important to stay hydrated: drink plenty of fluids (water, gatorade/powerade/pedialyte, juices, or teas) to keep your throat moisturized and help further relieve irritation/discomfort.   For your sciatica: take the naproxen twice a day for the next 5-7 days and then as needed for pain.    Rest and do the exercises in the hand out to help with pain.   Use heat, ice, or alternate heat and ice for comfort.  Follow up with ortho or sports medicine if your symptoms do not improve.   Return or go to the Emergency Department if symptoms worsen or do not improve in the next few days.

## 2020-06-26 LAB — SARS CORONAVIRUS 2 (TAT 6-24 HRS): SARS Coronavirus 2: POSITIVE — AB

## 2020-06-27 ENCOUNTER — Telehealth (HOSPITAL_COMMUNITY): Payer: Self-pay | Admitting: Family Medicine

## 2020-06-27 ENCOUNTER — Ambulatory Visit (HOSPITAL_COMMUNITY)
Admission: EM | Admit: 2020-06-27 | Discharge: 2020-06-27 | Disposition: A | Discharge status: Home / Self Care | Payer: Commercial Managed Care - PPO | Attending: Family Medicine | Admitting: Family Medicine

## 2020-06-27 ENCOUNTER — Other Ambulatory Visit: Payer: Self-pay

## 2020-06-27 DIAGNOSIS — R059 Cough, unspecified: Secondary | ICD-10-CM | POA: Diagnosis present

## 2020-06-27 DIAGNOSIS — J029 Acute pharyngitis, unspecified: Secondary | ICD-10-CM | POA: Diagnosis not present

## 2020-06-27 DIAGNOSIS — U071 COVID-19: Secondary | ICD-10-CM | POA: Diagnosis not present

## 2020-06-27 LAB — BASIC METABOLIC PANEL
Anion gap: 8 (ref 5–15)
BUN: 8 mg/dL (ref 6–20)
CO2: 23 mmol/L (ref 22–32)
Calcium: 8.5 mg/dL — ABNORMAL LOW (ref 8.9–10.3)
Chloride: 105 mmol/L (ref 98–111)
Creatinine, Ser: 0.91 mg/dL (ref 0.61–1.24)
GFR, Estimated: >60 mL/min (ref >60)
Glucose, Bld: 93 mg/dL (ref 70–99)
Potassium: 3.8 mmol/L (ref 3.5–5.1)
Sodium: 136 mmol/L (ref 135–145)

## 2020-06-27 MED ORDER — DEXAMETHASONE SODIUM PHOSPHATE 10 MG/ML IJ SOLN
INTRAMUSCULAR | Status: AC
  Filled 2020-06-27: qty 1

## 2020-06-27 MED ORDER — PAXLOVID 20 X 150 MG & 10 X 100MG PO TBPK: 3.0000 | ORAL_TABLET | Freq: Two times a day (BID) | ORAL | 0 refills | Status: AC

## 2020-06-27 MED ORDER — DEXAMETHASONE SODIUM PHOSPHATE 10 MG/ML IJ SOLN
10.0000 mg | Freq: Once | INTRAMUSCULAR | Status: AC
  Administered 2020-06-27: 10 mg via INTRAMUSCULAR

## 2020-06-27 NOTE — Telephone Encounter (Signed)
BMP showing normal kidney function, Paxlovid sent to pharmacy of patient's preference and patient notified to pick up the medication.

## 2020-06-27 NOTE — ED Provider Notes (Signed)
[facility]    CSN: [id number] Arrival date & time: 06/27/20  1009     History   Chief Complaint Chief Complaint  Patient presents with  . Shortness of Breath    COVID positive    HPI Martin Sloan is a 45 y.o. male.   Presenting today with concerns of feel like he is choking and cannot breathe the past 2 nights while trying to sleep.  States he tested positive for COVID 2 days ago, initially had fever and body aches, congestion, sore throat but now just having the cough and sore throat predominantly bothering him.  Was given Tessalon and Phenergan DM which she states makes things worse so he stopped taking them.  Currently not trying anything over-the-counter for symptoms.  No known history of pulmonary disease.  States his sister was given in antiviral medication recently when she had COVID and got better quickly so she would like this as well.    Past Medical History:  Diagnosis Date  . Hypertension     There are no problems to display for this patient.   No past surgical history on file.     Home Medications    Prior to Admission medications   Medication Sig Start Date End Date Taking? Authorizing Provider  benzonatate (TESSALON PERLES) 100 MG capsule Take 2 capsules (200 mg total) by mouth 3 (three) times daily as needed for cough. 06/25/20   Ivette Loyal, NP  ibuprofen (ADVIL,MOTRIN) 200 MG tablet Take 400 mg by mouth every 6 (six) hours as needed. Pain    [provider]  naproxen (NAPROSYN) 500 MG tablet Take 1 tablet (500 mg total) by mouth 2 (two) times daily. 06/25/20   Ivette Loyal, NP  omeprazole (PRILOSEC) 10 MG capsule Take 10 mg by mouth daily.    [provider]  promethazine-dextromethorphan (PROMETHAZINE-DM) 6.25-15 MG/5ML syrup Take 5 mLs by mouth 4 (four) times daily as needed for cough. 06/25/20   Ivette Loyal, NP    Family History Family History  Problem Relation Age of Onset  . Healthy Mother   . Healthy Father      Social History Social History   Tobacco Use  . Smoking status: Former Games developer  . Smokeless tobacco: Never Used  Vaping Use  . Vaping Use: Never used  Substance Use Topics  . Alcohol use: No  . Drug use: No     Allergies   Patient has no known allergies.   Review of Systems Review of Systems Per HPI  Physical Exam Triage Vital Signs ED Triage Vitals  Enc Vitals Group     BP 06/27/20 1127 (!) 147/101     Pulse Rate 06/27/20 1126 90     Resp 06/27/20 1126 16     Temp 06/27/20 1126 99.5 F (37.5 C)     Temp Source 06/27/20 1126 Oral     SpO2 06/27/20 1038 97 %     Weight --      Height --      Head Circumference --      Peak Flow --      Pain Score 06/27/20 1125 5     Pain Loc --      Pain Edu? --      Excl. in GC? --    No data found.  Updated Vital Signs BP (!) 147/101   Pulse 90   Temp 99.5 F (37.5 C) (Oral)   Resp 16   SpO2 99%  Visual Acuity Right Eye Distance:   Left Eye Distance:   Bilateral Distance:    Right Eye Near:   Left Eye Near:    Bilateral Near:     Physical Exam Vitals and nursing note reviewed.  Constitutional:      Appearance: Normal appearance.  HENT:     Head: Atraumatic.     Right Ear: Tympanic membrane normal.     Left Ear: Tympanic membrane normal.     Nose: Rhinorrhea present.     Comments: Significant erythema and edema bilateral nasal turbinates    Mouth/Throat:     Mouth: Mucous membranes are moist.     Pharynx: Posterior oropharyngeal erythema present.     Comments: Mild tonsillar erythema and edema bilaterally, no exudates.  Uvula midline, oral airway patent Eyes:     Extraocular Movements: Extraocular movements intact.     Conjunctiva/sclera: Conjunctivae normal.  Cardiovascular:     Rate and Rhythm: Normal rate and regular rhythm.  Pulmonary:     Effort: Pulmonary effort is normal. No respiratory distress.     Breath sounds: Normal breath sounds. No wheezing or rales.  Abdominal:     General:  Bowel sounds are normal. There is no distension.     Palpations: Abdomen is soft.     Tenderness: There is no abdominal tenderness. There is no guarding.  Musculoskeletal:        General: Normal range of motion.     Cervical back: Normal range of motion and neck supple.  Skin:    General: Skin is warm and dry.  Neurological:     General: No focal deficit present.     Mental Status: He is oriented to person, place, and time.  Psychiatric:        Mood and Affect: Mood normal.        Thought Content: Thought content normal.        Judgment: Judgment normal.    UC Treatments / Results  Labs (all labs ordered are listed, but only abnormal results are displayed) Labs Reviewed  BASIC METABOLIC PANEL    EKG  Radiology No results found.  Procedures Procedures (including critical care time)  Medications Ordered in UC Medications  dexamethasone (DECADRON) injection 10 mg (10 mg Intramuscular Given 06/27/20 1204)    Initial Impression / Assessment and Plan / UC Course  I have reviewed the triage vital signs and the nursing notes.  Pertinent labs & imaging results that were available during my care of the patient were reviewed by me and considered in my medical decision making (see chart for details).     Patient overall very well-appearing today, mildly hypertensive in triage but otherwise vital signs normal and reassuring.  We will give IM Decadron in clinic today for symptomatic improvement of his perceived chest tightness, swollen throat.  He is also firmly requesting the antivirals for COVID, discussed the possible risks/side effects with this and he wants to pursue treatment.  Do not have any recent blood work on file, BMP pending.  If indicated, will prescribe Paxlovid.  Strict return precautions given for worsening symptoms.  Final Clinical Impressions(s) / UC Diagnoses   Final diagnoses:  COVID-19  Cough  Sore throat   Discharge Instructions   None    ED  Prescriptions    None     PDMP not reviewed this encounter.   Particia Nearing, [place] 06/27/20 1215

## 2020-06-27 NOTE — ED Triage Notes (Signed)
PT is COVID positive. States he woke up feeling like he was choking twice last night.

## 2020-09-04 ENCOUNTER — Other Ambulatory Visit: Payer: Self-pay

## 2020-09-04 ENCOUNTER — Ambulatory Visit (HOSPITAL_COMMUNITY)
Admission: EM | Admit: 2020-09-04 | Discharge: 2020-09-04 | Disposition: A | Discharge status: Home / Self Care | Payer: Commercial Managed Care - PPO | Attending: Emergency Medicine | Admitting: Emergency Medicine

## 2020-09-04 ENCOUNTER — Encounter (HOSPITAL_COMMUNITY): Payer: Self-pay

## 2020-09-04 DIAGNOSIS — M546 Pain in thoracic spine: Secondary | ICD-10-CM | POA: Diagnosis not present

## 2020-09-04 DIAGNOSIS — M542 Cervicalgia: Secondary | ICD-10-CM | POA: Diagnosis not present

## 2020-09-04 MED ORDER — PREDNISONE 20 MG PO TABS: 40.0000 mg | ORAL_TABLET | Freq: Every day | ORAL | 0 refills | Status: DC

## 2020-09-04 MED ORDER — CYCLOBENZAPRINE HCL 10 MG PO TABS: 10.0000 mg | ORAL_TABLET | Freq: Every day | ORAL | 0 refills | Status: DC

## 2020-09-04 MED ORDER — IBUPROFEN 600 MG PO TABS: 600.0000 mg | ORAL_TABLET | Freq: Four times a day (QID) | ORAL | 0 refills | Status: DC | PRN

## 2020-09-04 NOTE — ED Triage Notes (Signed)
Pt presents with back pain, states it is painful when moving and breathing in.   States he has had the problem before and states he never was checked for it. States this time around the pain is worse.

## 2020-09-04 NOTE — Discharge Instructions (Addendum)
Starting tomorrow morning take prednisone with food for the next 5 days  Can use Flexeril as needed for additional comfort before bed, be mindful this medication may make you drowsy  Once prednisone is complete can use ibuprofen as needed every 6 hours for pain management  For persistent or recurring pain can follow-up with orthopedic specialist of your choosing for long-term management and evaluation  Inside your packet of exercises that can you can try to help relieve your pain and strengthen your neck muscles

## 2020-09-04 NOTE — ED Provider Notes (Signed)
[facility]    CSN: [id number] Arrival date & time: 09/04/20  1749      History   Chief Complaint No chief complaint on file.   HPI Martin Sloan is a 45 y.o. male.   Patient presents with mid back pain beginning today after workout of his arms.  Pain worsened with twisting, turning and bending of back as well as range of motion of neck.  Denies any numbness, tingling, injury or trauma.  Has had back pain before in a different location.  Past Medical History:  Diagnosis Date   Hypertension     There are no problems to display for this patient.   History reviewed. No pertinent surgical history.     Home Medications    Prior to Admission medications   Medication Sig Start Date End Date Taking? Authorizing Provider  cyclobenzaprine (FLEXERIL) 10 MG tablet Take 1 tablet (10 mg total) by mouth at bedtime. 09/04/20  Yes Marylen Zuk R, NP  predniSONE (DELTASONE) 20 MG tablet Take 2 tablets (40 mg total) by mouth daily. 09/04/20  Yes Besnik Febus R, NP  benzonatate (TESSALON PERLES) 100 MG capsule Take 2 capsules (200 mg total) by mouth 3 (three) times daily as needed for cough. 06/25/20   Ivette Loyal, NP  ibuprofen (ADVIL) 600 MG tablet Take 1 tablet (600 mg total) by mouth every 6 (six) hours as needed. Pain 09/04/20   Dequarius Jeffries, Elita Boone, NP  naproxen (NAPROSYN) 500 MG tablet Take 1 tablet (500 mg total) by mouth 2 (two) times daily. 06/25/20   Ivette Loyal, NP  omeprazole (PRILOSEC) 10 MG capsule Take 10 mg by mouth daily.    [provider]  promethazine-dextromethorphan (PROMETHAZINE-DM) 6.25-15 MG/5ML syrup Take 5 mLs by mouth 4 (four) times daily as needed for cough. 06/25/20   Ivette Loyal, NP    Family History Family History  Problem Relation Age of Onset   Healthy Mother    Healthy Father     Social History Social History   Tobacco Use   Smoking status: Former   Smokeless tobacco: Never  Building services engineer Use: Never used   Substance Use Topics   Alcohol use: No   Drug use: No     Allergies   Patient has no known allergies.   Review of Systems Review of Systems  Constitutional: Negative.   Respiratory: Negative.    Cardiovascular: Negative.   Musculoskeletal:  Positive for back pain. Negative for arthralgias, gait problem, joint swelling, myalgias, neck pain and neck stiffness.  Skin: Negative.     Physical Exam Triage Vital Signs ED Triage Vitals  Enc Vitals Group     BP 09/04/20 1825 (!) 143/92     Pulse Rate 09/04/20 1825 71     Resp 09/04/20 1825 19     Temp 09/04/20 1825 98.7 F (37.1 C)     Temp Source 09/04/20 1825 Oral     SpO2 09/04/20 1825 98 %     Weight --      Height --      Head Circumference --      Peak Flow --      Pain Score 09/04/20 1824 8     Pain Loc --      Pain Edu? --      Excl. in GC? --    No data found.  Updated Vital Signs BP (!) 143/92 (BP Location: Right Arm)   Pulse 71  Temp 98.7 F (37.1 C) (Oral)   Resp 19   SpO2 98%   Visual Acuity Right Eye Distance:   Left Eye Distance:   Bilateral Distance:    Right Eye Near:   Left Eye Near:    Bilateral Near:     Physical Exam Constitutional:      Appearance: Normal appearance. He is normal weight.  Eyes:     Extraocular Movements: Extraocular movements intact.  Pulmonary:     Effort: Pulmonary effort is normal.  Musculoskeletal:     Cervical back: Tenderness present. No swelling, spasms or bony tenderness. Pain with movement present. Normal range of motion.     Thoracic back: Tenderness present. No swelling or spasms. Normal range of motion.     Lumbar back: Normal.       Back:  Skin:    General: Skin is warm and dry.  Neurological:     General: No focal deficit present.     Mental Status: He is alert and oriented to person, place, and time. Mental status is at baseline.  Psychiatric:        Mood and Affect: Mood normal.        Behavior: Behavior normal.     UC Treatments /  Results  Labs (all labs ordered are listed, but only abnormal results are displayed) Labs Reviewed - No data to display  EKG   Radiology No results found.  Procedures Procedures (including critical care time)  Medications Ordered in UC Medications - No data to display  Initial Impression / Assessment and Plan / UC Course  I have reviewed the triage vital signs and the nursing notes.  Pertinent labs & imaging results that were available during my care of the patient were reviewed by me and considered in my medical decision making (see chart for details).  Acute midline thoracic back pain Cervical pain  1.  Prednisone 40 mg daily for 5 days 2.  Ibuprofen 600 mg every 6 hours as needed.  After completion of steroid 3.  Flexeril 10 mg at bedtime for additional comfort 4.  Orthopedic follow-up for persistent or recurrent pain 5.  Patient given rehabilitation exercises to strengthen/stretch cervical and thoracic muscles and spine Final Clinical Impressions(s) / UC Diagnoses   Final diagnoses:  Acute midline thoracic back pain  Cervical pain (neck)     Discharge Instructions      Starting tomorrow morning take prednisone with food for the next 5 days  Can use Flexeril as needed for additional comfort before bed, be mindful this medication may make you drowsy  Once prednisone is complete can use ibuprofen as needed every 6 hours for pain management  For persistent or recurring pain can follow-up with orthopedic specialist of your choosing for long-term management and evaluation  Inside your packet of exercises that can you can try to help relieve your pain and strengthen your neck muscles   ED Prescriptions     Medication Sig Dispense Auth. Provider   predniSONE (DELTASONE) 20 MG tablet Take 2 tablets (40 mg total) by mouth daily. 10 tablet Victorious Kundinger R, NP   ibuprofen (ADVIL) 600 MG tablet Take 1 tablet (600 mg total) by mouth every 6 (six) hours as needed. Pain  30 tablet Alyona Romack R, NP   cyclobenzaprine (FLEXERIL) 10 MG tablet Take 1 tablet (10 mg total) by mouth at bedtime. 10 tablet Valinda Hoar, NP      PDMP not reviewed this encounter.   Salli Quarry  R, NP 09/04/20 1915

## 2021-03-24 ENCOUNTER — Other Ambulatory Visit: Payer: Self-pay

## 2021-03-24 ENCOUNTER — Ambulatory Visit (HOSPITAL_COMMUNITY)
Admission: EM | Admit: 2021-03-24 | Discharge: 2021-03-24 | Disposition: A | Discharge status: Home / Self Care | Payer: Self-pay | Attending: Emergency Medicine | Admitting: Emergency Medicine

## 2021-03-24 ENCOUNTER — Encounter (HOSPITAL_COMMUNITY): Payer: Self-pay | Admitting: Emergency Medicine

## 2021-03-24 DIAGNOSIS — R369 Urethral discharge, unspecified: Secondary | ICD-10-CM | POA: Insufficient documentation

## 2021-03-24 LAB — HIV ANTIBODY (ROUTINE TESTING W REFLEX): HIV Screen 4th Generation wRfx: NONREACTIVE

## 2021-03-24 MED ORDER — DOXYCYCLINE HYCLATE 100 MG PO CAPS: 100.0000 mg | ORAL_CAPSULE | Freq: Two times a day (BID) | ORAL | 0 refills | Status: DC

## 2021-03-24 NOTE — ED Provider Notes (Signed)
Martin Sloan    CSN: [id number] Arrival date & time: 03/24/21  1621      History   Chief Complaint Chief Complaint  Patient presents with   Exposure to STD    HPI Martin Sloan is a 46 y.o. male.   Patient presents with Martin Sloan to yellow penile discharge, urinary frequency and urgency and generalized abdominal pain for 1 week.  Associated dysuria x1.  Endorses exposure to chlamydia, bacterial vaginosis and yeast.  Sexually active, 2 partners, sometimes condom use.  Denies hematuria, flank pain, penile or testicle swelling, new rash or lesions, fever, chills.     Past Medical History:  Diagnosis Date   Hypertension     There are no problems to display for this patient.   History reviewed. No pertinent surgical history.     Home Medications    Prior to Admission medications   Medication Sig Start Date End Date Taking? Authorizing Provider  benzonatate (TESSALON PERLES) 100 MG capsule Take 2 capsules (200 mg total) by mouth 3 (three) times daily as needed for cough. 06/25/20   Pearson Forster, NP  cyclobenzaprine (FLEXERIL) 10 MG tablet Take 1 tablet (10 mg total) by mouth at bedtime. 09/04/20   Martine Bleecker, Leitha Schuller, NP  ibuprofen (ADVIL) 600 MG tablet Take 1 tablet (600 mg total) by mouth every 6 (six) hours as needed. Pain 09/04/20   Vyncent Overby, Leitha Schuller, NP  naproxen (NAPROSYN) 500 MG tablet Take 1 tablet (500 mg total) by mouth 2 (two) times daily. 06/25/20   Pearson Forster, NP  omeprazole (PRILOSEC) 10 MG capsule Take 10 mg by mouth daily.    [provider]  predniSONE (DELTASONE) 20 MG tablet Take 2 tablets (40 mg total) by mouth daily with breakfast. 09/04/20   Hans Eden, NP  promethazine-dextromethorphan (PROMETHAZINE-DM) 6.25-15 MG/5ML syrup Take 5 mLs by mouth 4 (four) times daily as needed for cough. 06/25/20   Pearson Forster, NP    Family History Family History  Problem Relation Age of Onset   Healthy Mother    Healthy Father     Social  History Social History   Tobacco Use   Smoking status: Former   Smokeless tobacco: Never  Scientific laboratory technician Use: Never used  Substance Use Topics   Alcohol use: No   Drug use: No     Allergies   Patient has no known allergies.   Review of Systems Review of Systems  Respiratory: Negative.    Cardiovascular: Negative.   Gastrointestinal:  Positive for abdominal pain. Negative for abdominal distention, anal bleeding, blood in stool, constipation, diarrhea, nausea, rectal pain and vomiting.  Genitourinary:  Positive for dysuria, frequency and penile discharge. Negative for decreased urine volume, difficulty urinating, enuresis, flank pain, genital sores, hematuria, penile pain, penile swelling, scrotal swelling, testicular pain and urgency.  Skin: Negative.     Physical Exam Triage Vital Signs ED Triage Vitals  Enc Vitals Group     BP 03/24/21 1702 (!) 157/102     Pulse Rate 03/24/21 1702 80     Resp 03/24/21 1702 16     Temp 03/24/21 1702 98.3 F (36.8 C)     Temp src --      SpO2 03/24/21 1702 99 %     Weight 03/24/21 1703 190 lb 0.6 oz (86.2 kg)     Height 03/24/21 1703 5\' 10"  (1.778 m)     Head Circumference --      Peak  Flow --      Pain Score 03/24/21 1702 1     Pain Loc --      Pain Edu? --      Excl. in [place]? --    No data found.  Updated Vital Signs BP (!) 157/102 (BP Location: Left Arm)    Pulse 80    Temp 98.3 F (36.8 C)    Resp 16    Ht 5\' 10"  (1.778 m)    Wt 190 lb 0.6 oz (86.2 kg)    SpO2 99%    BMI 27.27 kg/m   Visual Acuity Right Eye Distance:   Left Eye Distance:   Bilateral Distance:    Right Eye Near:   Left Eye Near:    Bilateral Near:     Physical Exam Constitutional:      Appearance: Normal appearance.  HENT:     Head: Normocephalic.  Eyes:     Extraocular Movements: Extraocular movements intact.  Pulmonary:     Effort: Pulmonary effort is normal.  Genitourinary:    Comments: deferred Skin:    General: Skin is warm and dry.   Neurological:     Mental Status: He is alert and oriented to person, place, and time. Mental status is at baseline.  Psychiatric:        Mood and Affect: Mood normal.        Behavior: Behavior normal.     UC Treatments / Results  Labs (all labs ordered are listed, but only abnormal results are displayed) Labs Reviewed  HIV ANTIBODY (ROUTINE TESTING W REFLEX)  RPR  CYTOLOGY, (ORAL, ANAL, URETHRAL) ANCILLARY ONLY    EKG   Radiology No results found.  Procedures Procedures (including critical care time)  Medications Ordered in UC Medications - No data to display  Initial Impression / Assessment and Plan / UC Course  I have reviewed the triage vital signs and the nursing notes.  Pertinent labs & imaging results that were available during my care of the patient were reviewed by me and considered in my medical decision making (see chart for details).  Penile discharge  We will prophylactically treat for chlamydia due to known exposure, doxycycline 7-day course prescribed, STI screening pending, will treat per protocol, advised abstinence until all labs have resulted, symptoms have resolved and medication has been completed, advised condom use during all sexual encounters moving forward, may follow-up with urgent care as needed Final Clinical Impressions(s) / UC Diagnoses   Final diagnoses:  None     Discharge Instructions      Labs pending 2-3 days, you will be contacted if positive for any sti and treatment will be sent to the pharmacy, you will have to return to the clinic if positive for gonorrhea to receive treatment   Please refrain from having sex until labs results, if positive please refrain from having sex until treatment complete and symptoms resolve   If positive for HIV, Syphilis, Chlamydia  gonorrhea or trichomoniasis please notify partner or partners so they may tested as well  Moving forward, it is recommended you use some form of protection against the  transmission of sti infections  such as condoms or dental dams with each sexual encounter     ED Prescriptions   None    PDMP not reviewed this encounter.   Hans Eden, NP 03/24/21 1736

## 2021-03-24 NOTE — ED Triage Notes (Signed)
Pt reports a possible exposure to an STD, reports penile discharge with mild abdominal discomfort x 1.  Requesting STD testing with blood work.

## 2021-03-24 NOTE — Discharge Instructions (Addendum)
Today you are being treated prophylactically for chlamydia, take doxycycline twice a day for the next 7 days  Labs pending 2-3 days, you will be contacted if positive for any sti and treatment will be sent to the pharmacy, you will have to return to the clinic if positive for gonorrhea to receive treatment   Please refrain from having sex until labs results, if positive please refrain from having sex until treatment complete and symptoms resolve   If positive for HIV, Syphilis, Chlamydia  gonorrhea or trichomoniasis please notify partner or partners so they may tested as well  Moving forward, it is recommended you use some form of protection against the transmission of sti infections  such as condoms or dental dams with each sexual encounter   

## 2021-03-25 LAB — CYTOLOGY, (ORAL, ANAL, URETHRAL) ANCILLARY ONLY
Chlamydia: NEGATIVE
Comment: NEGATIVE
Comment: NEGATIVE
Comment: NORMAL
Neisseria Gonorrhea: NEGATIVE
Trichomonas: NEGATIVE

## 2021-03-25 LAB — RPR: RPR Ser Ql: NONREACTIVE

## 2021-04-24 ENCOUNTER — Encounter (HOSPITAL_COMMUNITY): Payer: Self-pay | Admitting: Emergency Medicine

## 2021-04-24 ENCOUNTER — Other Ambulatory Visit: Payer: Self-pay

## 2021-04-24 ENCOUNTER — Emergency Department (HOSPITAL_COMMUNITY)
Admission: EM | Admit: 2021-04-24 | Discharge: 2021-04-24 | Disposition: A | Discharge status: Home / Self Care | Payer: Self-pay | Attending: Emergency Medicine | Admitting: Emergency Medicine

## 2021-04-24 DIAGNOSIS — R7989 Other specified abnormal findings of blood chemistry: Secondary | ICD-10-CM | POA: Insufficient documentation

## 2021-04-24 DIAGNOSIS — I10 Essential (primary) hypertension: Secondary | ICD-10-CM | POA: Insufficient documentation

## 2021-04-24 DIAGNOSIS — K625 Hemorrhage of anus and rectum: Secondary | ICD-10-CM | POA: Insufficient documentation

## 2021-04-24 DIAGNOSIS — Z79899 Other long term (current) drug therapy: Secondary | ICD-10-CM | POA: Insufficient documentation

## 2021-04-24 DIAGNOSIS — K649 Unspecified hemorrhoids: Secondary | ICD-10-CM | POA: Insufficient documentation

## 2021-04-24 LAB — CBC
HCT: 45.8 % (ref 39.0–52.0)
Hemoglobin: 15.7 g/dL (ref 13.0–17.0)
MCH: 29.2 pg (ref 26.0–34.0)
MCHC: 34.3 g/dL (ref 30.0–36.0)
MCV: 85.3 fL (ref 80.0–100.0)
Platelets: 245 10*3/uL (ref 150–400)
RBC: 5.37 MIL/uL (ref 4.22–5.81)
RDW: 13.2 % (ref 11.5–15.5)
WBC: 5.1 10*3/uL (ref 4.0–10.5)
nRBC: 0 % (ref 0.0–0.2)

## 2021-04-24 LAB — TYPE AND SCREEN
ABO/RH(D): B POS
Antibody Screen: NEGATIVE

## 2021-04-24 LAB — COMPREHENSIVE METABOLIC PANEL
ALT: 29 U/L (ref 0–44)
AST: 25 U/L (ref 15–41)
Albumin: 4.2 g/dL (ref 3.5–5.0)
Alkaline Phosphatase: 56 U/L (ref 38–126)
Anion gap: 8 (ref 5–15)
BUN: 10 mg/dL (ref 6–20)
CO2: 28 mmol/L (ref 22–32)
Calcium: 9.5 mg/dL (ref 8.9–10.3)
Chloride: 105 mmol/L (ref 98–111)
Creatinine, Ser: 1.26 mg/dL — ABNORMAL HIGH (ref 0.61–1.24)
GFR, Estimated: >60 mL/min (ref >60)
Glucose, Bld: 109 mg/dL — ABNORMAL HIGH (ref 70–99)
Potassium: 3.5 mmol/L (ref 3.5–5.1)
Sodium: 141 mmol/L (ref 135–145)
Total Bilirubin: 0.5 mg/dL (ref 0.3–1.2)
Total Protein: 6.8 g/dL (ref 6.5–8.1)

## 2021-04-24 LAB — POC OCCULT BLOOD, ED: Fecal Occult Bld: NEGATIVE

## 2021-04-24 LAB — ABO/RH: ABO/RH(D): B POS

## 2021-04-24 MED ORDER — HYDROCORTISONE ACETATE 25 MG RE SUPP: 25.0000 mg | Freq: Two times a day (BID) | RECTAL | 0 refills | Status: DC

## 2021-04-24 NOTE — ED Provider Notes (Signed)
?[facility] ?Provider Note ? ? ?CSN: [id number] ?Arrival date & time: 04/24/21  1801 ? ?  ? ?History ? ?Chief Complaint  ?Patient presents with  ? Rectal Bleeding  ? ? ?Martin Sloan is a 46 y.o. male. ? ?Pt is a 46 yo male with a hx of hemorrhoids and htn.  Pt said he has had surgery on his hemorrhoids in the past.  Today, he had 5 bowel movements and had blood in each one.  Pt said the bowel movements were normal and not painful.  He denies any rectal trauma.  He said he has not been eating well and thinks he's been drinking too much.  Pt showed me a picture.  The water in the toilet bowl was red from the blood.  No clots. ? ? ?  ? ?Home Medications ?Prior to Admission medications   ?Medication Sig Start Date End Date Taking? Authorizing Provider  ?hydrocortisone (ANUSOL-HC) 25 MG suppository Place 1 suppository (25 mg total) rectally 2 (two) times daily. 04/24/21  Yes Isla Pence, MD  ?benzonatate (TESSALON PERLES) 100 MG capsule Take 2 capsules (200 mg total) by mouth 3 (three) times daily as needed for cough. 06/25/20   Pearson Forster, NP  ?cyclobenzaprine (FLEXERIL) 10 MG tablet Take 1 tablet (10 mg total) by mouth at bedtime. 09/04/20   Hans Eden, NP  ?doxycycline (VIBRAMYCIN) 100 MG capsule Take 1 capsule (100 mg total) by mouth 2 (two) times daily. 03/24/21   Hans Eden, NP  ?ibuprofen (ADVIL) 600 MG tablet Take 1 tablet (600 mg total) by mouth every 6 (six) hours as needed. Pain 09/04/20   Hans Eden, NP  ?naproxen (NAPROSYN) 500 MG tablet Take 1 tablet (500 mg total) by mouth 2 (two) times daily. 06/25/20   Pearson Forster, NP  ?omeprazole (PRILOSEC) 10 MG capsule Take 10 mg by mouth daily.    [provider]  ?predniSONE (DELTASONE) 20 MG tablet Take 2 tablets (40 mg total) by mouth daily with breakfast. 09/04/20   Hans Eden, NP  ?promethazine-dextromethorphan (PROMETHAZINE-DM) 6.25-15 MG/5ML syrup Take 5 mLs by mouth 4 (four)  times daily as needed for cough. 06/25/20   Pearson Forster, NP  ?   ? ?Allergies    ?Patient has no known allergies.   ? ?Review of Systems   ?Review of Systems  ?Gastrointestinal:  Positive for blood in stool.  ?All other systems reviewed and are negative. ? ?Physical Exam ?Updated Vital Signs ?BP (!) 140/101 (BP Location: Right Arm)   Pulse 67   Temp 98.7 ?F (37.1 ?C) (Oral)   Resp 13   Ht 5\' 11"  (1.803 m)   Wt 99.8 kg   SpO2 98%   BMI 30.68 kg/m?  ?Physical Exam ?Vitals and nursing note reviewed. Exam conducted with a chaperone present.  ?Constitutional:   ?   Appearance: Normal appearance.  ?HENT:  ?   Head: Normocephalic and atraumatic.  ?   Right Ear: External ear normal.  ?   Left Ear: External ear normal.  ?   Nose: Nose normal.  ?   Mouth/Throat:  ?   Mouth: Mucous membranes are moist.  ?   Pharynx: Oropharynx is clear.  ?Eyes:  ?   Extraocular Movements: Extraocular movements intact.  ?   Conjunctiva/sclera: Conjunctivae normal.  ?   Pupils: Pupils are equal, round, and reactive to light.  ?Cardiovascular:  ?   Rate and Rhythm: Normal rate and  regular rhythm.  ?   Pulses: Normal pulses.  ?   Heart sounds: Normal heart sounds.  ?Pulmonary:  ?   Effort: Pulmonary effort is normal.  ?   Breath sounds: Normal breath sounds.  ?Abdominal:  ?   General: Abdomen is flat. Bowel sounds are normal.  ?   Palpations: Abdomen is soft.  ?Genitourinary: ?   Rectum: Guaiac result negative. External hemorrhoid present.  ?Musculoskeletal:     ?   General: Normal range of motion.  ?   Cervical back: Normal range of motion and neck supple.  ?Skin: ?   General: Skin is warm.  ?   Capillary Refill: Capillary refill takes less than 2 seconds.  ?Neurological:  ?   General: No focal deficit present.  ?   Mental Status: He is alert and oriented to person, place, and time.  ?Psychiatric:     ?   Mood and Affect: Mood normal.     ?   Behavior: Behavior normal.  ? ? ?ED Results / Procedures / Treatments   ?Labs ?(all labs ordered  are listed, but only abnormal results are displayed) ?Labs Reviewed  ?COMPREHENSIVE METABOLIC PANEL - Abnormal; Notable for the following components:  ?    Result Value  ? Glucose, Bld 109 (*)   ? Creatinine, Ser 1.26 (*)   ? All other components within normal limits  ?CBC  ?POC OCCULT BLOOD, ED  ?TYPE AND SCREEN  ?ABO/RH  ? ? ?EKG ?None ? ?Radiology ?No results found. ? ?Procedures ?Procedures  ? ? ?Medications Ordered in ED ?Medications - No data to display ? ?ED Course/ Medical Decision Making/ A&P ?  ?                        ?Medical Decision Making ?Amount and/or Complexity of Data Reviewed ?Labs: ordered. ? ?Risk ?Prescription drug management. ? ? ?This patient presents to the ED for concern of rectal bleeding, this involves an extensive number of treatment options, and is a complaint that carries with it a high risk of complications and morbidity.  The differential diagnosis includes hemorrhoids, diverticular bleed, trauma ? ? ?Co morbidities that complicate the patient evaluation ? ?htn ? ? ?Additional history obtained: ? ?Additional history obtained from epic chart review ? ? ? ?Lab Tests: ? ?I Ordered, and personally interpreted labs.  The pertinent results include:  nl cbc, cmp with slight elevation of Cr at 1.26.  Stool guaiac neg. ? ? ? ?Cardiac Monitoring: ? ?The patient was maintained on a cardiac monitor.  I personally viewed and interpreted the cardiac monitored which showed an underlying rhythm of: nsr ? ? ?Problem List / ED Course: ? ?Rectal bleeding:  likely hemorrhoidal.  H/h are good.  Vitals are nl.  Stool is guaiac neg.  He is encouraged to increase fluid intake and to eat foods higher in fiber.  He needs to f/u with GI.  Return if worse.  ? ? ?Reevaluation: ? ?After the interventions noted above, I reevaluated the patient and found that they have :improved ? ? ?Social Determinants of Health: ? ?Lives at home ? ? ?Dispostion: ? ?After consideration of the diagnostic results and the patients  response to treatment, I feel that the patent would benefit from d/c with outpatient f/u.   ? ? ? ? ? ? ? ?Final Clinical Impression(s) / ED Diagnoses ?Final diagnoses:  ?Rectal bleeding  ?Hemorrhoids, unspecified hemorrhoid type  ? ? ?Rx / DC Orders ?  ED Discharge Orders   ? ?      Ordered  ?  hydrocortisone (ANUSOL-HC) 25 MG suppository  2 times daily       ? 04/24/21 2040  ? ?  ?  ? ?  ? ? ?  ?Isla Pence, MD ?04/24/21 2047 ? ?

## 2021-04-24 NOTE — ED Notes (Signed)
Discharge instructions reviewed with patient. Patient verbalized understanding of instructions. Follow-up care and medications were reviewed. Patient ambulatory with steady gait. VSS upon discharge.  ?

## 2021-04-24 NOTE — ED Provider Triage Note (Signed)
Emergency Medicine Provider Triage Evaluation Note ? ?Martin Sloan , a 46 y.o. male  was evaluated in triage.  Pt complains of bright red blood in stool.  Patient reports 5 episodes of the same today.  Patient reports history of hemorrhoids, has hemorrhoid removal before.  Patient denies lightheadedness, dizziness, weakness.  Patient denies shortness of breath. ? ?Review of Systems  ?Positive: Prior blood in stool ?Negative: Lightheadedness, dizziness, weakness, shortness of breath ? ?Physical Exam  ?BP (!) 150/98 (BP Location: Right Arm)   Pulse 92   Temp 98.7 ?F (37.1 ?C) (Oral)   Resp 16   SpO2 96%  ?Gen:   Awake, no distress   ?Resp:  Normal effort  ?MSK:   Moves extremities without difficulty  ?Other:   ? ?Medical Decision Making  ?Medically screening exam initiated at 6:14 PM.  Appropriate orders placed.  BERK PILOT was informed that the remainder of the evaluation will be completed by another provider, this initial triage assessment does not replace that evaluation, and the importance of remaining in the ED until their evaluation is complete. ? ? ?  ?Al Decant, PA-C ?04/24/21 1815 ? ?

## 2021-04-24 NOTE — ED Triage Notes (Signed)
Patient complains of multiple bowel movements today with bright red blood in his stool. Denies taking anticoagulants, denies pain, denies dizziness, denies lightheadedness. Patient is alert, oriented, ambulatory, and in no apparent distress at this time. ?

## 2021-12-25 DIAGNOSIS — Z419 Encounter for procedure for purposes other than remedying health state, unspecified: Secondary | ICD-10-CM | POA: Diagnosis not present

## 2022-01-25 DIAGNOSIS — Z419 Encounter for procedure for purposes other than remedying health state, unspecified: Secondary | ICD-10-CM | POA: Diagnosis not present

## 2022-02-01 DIAGNOSIS — B349 Viral infection, unspecified: Secondary | ICD-10-CM | POA: Diagnosis not present

## 2022-02-01 DIAGNOSIS — U071 COVID-19: Secondary | ICD-10-CM | POA: Diagnosis not present

## 2022-02-01 DIAGNOSIS — R509 Fever, unspecified: Secondary | ICD-10-CM | POA: Diagnosis not present

## 2022-02-25 DIAGNOSIS — Z419 Encounter for procedure for purposes other than remedying health state, unspecified: Secondary | ICD-10-CM | POA: Diagnosis not present

## 2022-03-04 ENCOUNTER — Telehealth: Payer: Self-pay

## 2022-03-04 NOTE — Telephone Encounter (Signed)
Mychart msg sent. AS, CMA 

## 2022-03-26 DIAGNOSIS — Z419 Encounter for procedure for purposes other than remedying health state, unspecified: Secondary | ICD-10-CM | POA: Diagnosis not present

## 2022-04-05 DIAGNOSIS — R030 Elevated blood-pressure reading, without diagnosis of hypertension: Secondary | ICD-10-CM | POA: Diagnosis not present

## 2022-04-05 DIAGNOSIS — I10 Essential (primary) hypertension: Secondary | ICD-10-CM | POA: Diagnosis not present

## 2022-04-05 DIAGNOSIS — G4484 Primary exertional headache: Secondary | ICD-10-CM | POA: Diagnosis not present

## 2022-04-06 ENCOUNTER — Emergency Department (HOSPITAL_COMMUNITY): Payer: Medicaid Other

## 2022-04-06 ENCOUNTER — Emergency Department (HOSPITAL_COMMUNITY)
Admission: EM | Admit: 2022-04-06 | Discharge: 2022-04-06 | Disposition: A | Discharge status: Home / Self Care | Payer: Medicaid Other | Attending: Emergency Medicine | Admitting: Emergency Medicine

## 2022-04-06 ENCOUNTER — Encounter (HOSPITAL_COMMUNITY): Payer: Self-pay

## 2022-04-06 DIAGNOSIS — R079 Chest pain, unspecified: Secondary | ICD-10-CM | POA: Diagnosis not present

## 2022-04-06 DIAGNOSIS — R519 Headache, unspecified: Secondary | ICD-10-CM | POA: Diagnosis not present

## 2022-04-06 DIAGNOSIS — I10 Essential (primary) hypertension: Secondary | ICD-10-CM

## 2022-04-06 DIAGNOSIS — G4484 Primary exertional headache: Secondary | ICD-10-CM

## 2022-04-06 LAB — CBC WITH DIFFERENTIAL/PLATELET
Abs Immature Granulocytes: 0.01 10*3/uL (ref 0.00–0.07)
Basophils Absolute: 0 10*3/uL (ref 0.0–0.1)
Basophils Relative: 1 %
Eosinophils Absolute: 0.1 10*3/uL (ref 0.0–0.5)
Eosinophils Relative: 2 %
HCT: 43.2 % (ref 39.0–52.0)
Hemoglobin: 15 g/dL (ref 13.0–17.0)
Immature Granulocytes: 0 %
Lymphocytes Relative: 37 %
Lymphs Abs: 1.5 10*3/uL (ref 0.7–4.0)
MCH: 29.6 pg (ref 26.0–34.0)
MCHC: 34.7 g/dL (ref 30.0–36.0)
MCV: 85.2 fL (ref 80.0–100.0)
Monocytes Absolute: 0.3 10*3/uL (ref 0.1–1.0)
Monocytes Relative: 7 %
Neutro Abs: 2.1 10*3/uL (ref 1.7–7.7)
Neutrophils Relative %: 53 %
Platelets: 258 10*3/uL (ref 150–400)
RBC: 5.07 MIL/uL (ref 4.22–5.81)
RDW: 13.2 % (ref 11.5–15.5)
WBC: 4 10*3/uL (ref 4.0–10.5)
nRBC: 0 % (ref 0.0–0.2)

## 2022-04-06 LAB — COMPREHENSIVE METABOLIC PANEL
ALT: 25 U/L (ref 0–44)
AST: 27 U/L (ref 15–41)
Albumin: 4.2 g/dL (ref 3.5–5.0)
Alkaline Phosphatase: 50 U/L (ref 38–126)
Anion gap: 6 (ref 5–15)
BUN: 11 mg/dL (ref 6–20)
CO2: 23 mmol/L (ref 22–32)
Calcium: 8.9 mg/dL (ref 8.9–10.3)
Chloride: 107 mmol/L (ref 98–111)
Creatinine, Ser: 1.06 mg/dL (ref 0.61–1.24)
GFR, Estimated: >60 mL/min (ref >60)
Glucose, Bld: 90 mg/dL (ref 70–99)
Potassium: 3.8 mmol/L (ref 3.5–5.1)
Sodium: 136 mmol/L (ref 135–145)
Total Bilirubin: 0.6 mg/dL (ref 0.3–1.2)
Total Protein: 6.9 g/dL (ref 6.5–8.1)

## 2022-04-06 LAB — LIPASE, BLOOD: Lipase: 39 U/L (ref 11–51)

## 2022-04-06 MED ORDER — IOHEXOL 350 MG/ML SOLN
150.0000 mL | Freq: Once | INTRAVENOUS | Status: AC | PRN
  Administered 2022-04-06: 150 mL via INTRAVENOUS

## 2022-04-06 MED ORDER — AMLODIPINE BESYLATE 5 MG PO TABS: 5.0000 mg | ORAL_TABLET | Freq: Every day | ORAL | 0 refills | Status: DC

## 2022-04-06 NOTE — ED Provider Notes (Signed)
[facility] Provider Note   CSN: [id number] Arrival date & time: 04/06/22  1149     History  No chief complaint on file.   Martin Sloan is a 47 y.o. male.  HPI Patient presents after headaches.  Has had over the last few days headaches with exertion.  States he had episodes when he was 17 within the last few days.  No numbness weakness.  Goes away.  No chest pain.  But has had elevated blood pressure.  Seen in urgent care and sent here.  Headache has been dull.  Not on blood thinners.  No vision changes.   Past Medical History:  Diagnosis Date   Hypertension     Home Medications Prior to Admission medications   Medication Sig Start Date End Date Taking? Authorizing Provider  benzonatate (TESSALON PERLES) 100 MG capsule Take 2 capsules (200 mg total) by mouth 3 (three) times daily as needed for cough. 06/25/20   Pearson Forster, NP  cyclobenzaprine (FLEXERIL) 10 MG tablet Take 1 tablet (10 mg total) by mouth at bedtime. 09/04/20   White, Leitha Schuller, NP  doxycycline (VIBRAMYCIN) 100 MG capsule Take 1 capsule (100 mg total) by mouth 2 (two) times daily. 03/24/21   White, Leitha Schuller, NP  hydrocortisone (ANUSOL-HC) 25 MG suppository Place 1 suppository (25 mg total) rectally 2 (two) times daily. 04/24/21   Isla Pence, MD  ibuprofen (ADVIL) 600 MG tablet Take 1 tablet (600 mg total) by mouth every 6 (six) hours as needed. Pain 09/04/20   White, Leitha Schuller, NP  naproxen (NAPROSYN) 500 MG tablet Take 1 tablet (500 mg total) by mouth 2 (two) times daily. 06/25/20   Pearson Forster, NP  omeprazole (PRILOSEC) 10 MG capsule Take 10 mg by mouth daily.    [provider]  predniSONE (DELTASONE) 20 MG tablet Take 2 tablets (40 mg total) by mouth daily with breakfast. 09/04/20   Hans Eden, NP  promethazine-dextromethorphan (PROMETHAZINE-DM) 6.25-15 MG/5ML syrup Take 5 mLs by mouth 4 (four) times daily as needed for cough. 06/25/20   Pearson Forster, NP      Allergies    Patient has no known allergies.    Review of Systems   Review of Systems  Physical Exam Updated Vital Signs BP (!) 155/108 (BP Location: Right Arm)   Pulse (!) 101   Temp 98.9 F (37.2 C)   Resp 16   Ht '5\' 11"'$  (1.803 m)   Wt 99.8 kg   SpO2 99%   BMI 30.68 kg/m  Physical Exam Vitals and nursing note reviewed.  HENT:     Head: Atraumatic.  Cardiovascular:     Rate and Rhythm: Regular rhythm.  Abdominal:     Tenderness: There is no abdominal tenderness.  Musculoskeletal:     Cervical back: Neck supple.  Skin:    General: Skin is warm.     Capillary Refill: Capillary refill takes less than 2 seconds.  Neurological:     Mental Status: He is alert and oriented to person, place, and time.     ED Results / Procedures / Treatments   Labs (all labs ordered are listed, but only abnormal results are displayed) Labs Reviewed  COMPREHENSIVE METABOLIC PANEL  CBC WITH DIFFERENTIAL/PLATELET  LIPASE, BLOOD  URINALYSIS, ROUTINE W REFLEX MICROSCOPIC    EKG EKG Interpretation  Date/Time:  Tuesday April 06 2022 12:41:18 EDT Ventricular Rate:  91 PR Interval:  180 QRS Duration:  74 QT Interval:  340 QTC Calculation: 418 R Axis:   93 Text Interpretation: Normal sinus rhythm Rightward axis Nonspecific ST and T wave abnormality Abnormal ECG When compared with ECG of 25-Jun-2020 18:16, No significant change since last tracing Confirmed by Davonna Belling [phone]) on 04/06/2022 1:40:26 PM  Radiology DG Chest 1 View  Result Date: 04/06/2022 CLINICAL DATA:  Pain. EXAM: CHEST  1 VIEW COMPARISON:  None Available. FINDINGS: Cardiac silhouette and mediastinal contours are within normal limits. The lungs are clear. No pleural effusion or pneumothorax. No acute skeletal abnormality. IMPRESSION: No active disease. Electronically Signed   By: Yvonne Kendall M.D.   On: 04/06/2022 12:58    Procedures Procedures    Medications Ordered in ED Medications   iohexol (OMNIPAQUE) 350 MG/ML injection 150 mL (150 mLs Intravenous Contrast Given 04/06/22 1513)    ED Course/ Medical Decision Making/ A&P                             Medical Decision Making Amount and/or Complexity of Data Reviewed Radiology: ordered.  Risk Prescription drug management.   Patient with exertional headache.  Left-sided posterior scalp.  Pain goes away in between.  Will get basic blood work.  Also hypertension.  I think will require treatment has had elevated blood pressures recently.  However await CT and CT angiography to evaluate for causes of headache such as aneurysm.  Care will be turned over to Dr. Pearline Cables.        Final Clinical Impression(s) / ED Diagnoses Final diagnoses:  Exertional headache  Hypertension, unspecified type    Rx / DC Orders ED Discharge Orders     None         Davonna Belling, MD 04/06/22 1519

## 2022-04-06 NOTE — Progress Notes (Signed)
  Evaluation after Contrast Extravasation  Patient seen and examined immediately after contrast extravasation while in ED room 24.  Exam: There is mild swelling at the right ac area.  There is no erythema. There is no discoloration. There are no blisters. There are no signs of decreased perfusion of the skin.  It is  warm to touch.  The patient has full ROM in fingers.  Radial pulse is normal.  Per contrast extravasation protocol, I have instructed the patient to keep an ice pack on the area for 20-60 minutes at a time for about 48 hours.   Keep arm elevated as much as possible.   The patient understands to call the radiology department if there is: - increase in pain or swelling - changed or altered sensation - ulceration or blistering - increasing redness - warmth or increasing firmness - decreased tissue perfusion as noted by decreased capillary refill or discoloration of skin - decreased pulses peripheral to site   Jacqualine Mau PA-C 04/06/2022 3:22 PM

## 2022-04-06 NOTE — ED Triage Notes (Addendum)
Pt c/o HA, worse with strenuous activity, L side of head x 1 week; hx same at 47 years of age; went to urgent care yesterday, was told he needed CT; pt states his BP is always high, but has never been officially diagnosed with htn; not currently on BP meds; also states R hip, shoulder and wrist "give out on me"; pt alert, oriented, NAD in triage

## 2022-04-06 NOTE — ED Provider Triage Note (Signed)
Emergency Medicine Provider Triage Evaluation Note  Martin Sloan , a 47 y.o. male  was evaluated in triage.  Pt complains of headaches.  Patient reports that he has been having significant pain on the left side of his head associated with exertion.  He reports he is noticing most with physical activity at the gym and with sexual activity.  Reports that he has a prior history of elevated blood pressure but has never been diagnosed formally of hypertension and is not currently managing with any medications.  No prior history of any clots or strokes.  Not currently on blood thinners.  Denies any significant neurological deficits when symptoms arise.  Review of Systems  Positive: As above Negative: As above  Physical Exam  BP (!) 155/108 (BP Location: Right Arm)   Pulse (!) 101   Temp 98.9 F (37.2 C)   Resp 16   Ht '5\' 11"'$  (1.803 m)   Wt 99.8 kg   SpO2 99%   BMI 30.68 kg/m  Gen:   Awake, no distress   Resp:  Normal effort  MSK:   Moves extremities without difficulty  Other:    Medical Decision Making  Medically screening exam initiated at 12:29 PM.  Appropriate orders placed.  Martin Sloan was informed that the remainder of the evaluation will be completed by another provider, this initial triage assessment does not replace that evaluation, and the importance of remaining in the ED until their evaluation is complete.     Luvenia Heller, PA-C 04/06/22 1230

## 2022-04-26 DIAGNOSIS — Z419 Encounter for procedure for purposes other than remedying health state, unspecified: Secondary | ICD-10-CM | POA: Diagnosis not present

## 2022-05-05 DIAGNOSIS — I10 Essential (primary) hypertension: Secondary | ICD-10-CM | POA: Diagnosis not present

## 2022-05-05 DIAGNOSIS — K219 Gastro-esophageal reflux disease without esophagitis: Secondary | ICD-10-CM | POA: Diagnosis not present

## 2022-05-05 DIAGNOSIS — Z6830 Body mass index (BMI) 30.0-30.9, adult: Secondary | ICD-10-CM | POA: Diagnosis not present

## 2022-05-05 DIAGNOSIS — E291 Testicular hypofunction: Secondary | ICD-10-CM | POA: Diagnosis not present

## 2022-05-05 DIAGNOSIS — R635 Abnormal weight gain: Secondary | ICD-10-CM | POA: Diagnosis not present

## 2022-05-05 DIAGNOSIS — E559 Vitamin D deficiency, unspecified: Secondary | ICD-10-CM | POA: Diagnosis not present

## 2022-05-05 DIAGNOSIS — Z13220 Encounter for screening for lipoid disorders: Secondary | ICD-10-CM | POA: Diagnosis not present

## 2022-05-05 DIAGNOSIS — Z130 Encounter for screening for diseases of the blood and blood-forming organs and certain disorders involving the immune mechanism: Secondary | ICD-10-CM | POA: Diagnosis not present

## 2022-05-05 DIAGNOSIS — Z1329 Encounter for screening for other suspected endocrine disorder: Secondary | ICD-10-CM | POA: Diagnosis not present

## 2022-05-05 DIAGNOSIS — Z131 Encounter for screening for diabetes mellitus: Secondary | ICD-10-CM | POA: Diagnosis not present

## 2022-05-05 DIAGNOSIS — F419 Anxiety disorder, unspecified: Secondary | ICD-10-CM | POA: Diagnosis not present

## 2022-05-05 DIAGNOSIS — Z125 Encounter for screening for malignant neoplasm of prostate: Secondary | ICD-10-CM | POA: Diagnosis not present

## 2022-05-10 DIAGNOSIS — Z1331 Encounter for screening for depression: Secondary | ICD-10-CM | POA: Diagnosis not present

## 2022-05-10 DIAGNOSIS — E291 Testicular hypofunction: Secondary | ICD-10-CM | POA: Diagnosis not present

## 2022-05-10 DIAGNOSIS — F321 Major depressive disorder, single episode, moderate: Secondary | ICD-10-CM | POA: Diagnosis not present

## 2022-05-10 DIAGNOSIS — K219 Gastro-esophageal reflux disease without esophagitis: Secondary | ICD-10-CM | POA: Diagnosis not present

## 2022-05-10 DIAGNOSIS — Z1339 Encounter for screening examination for other mental health and behavioral disorders: Secondary | ICD-10-CM | POA: Diagnosis not present

## 2022-05-10 DIAGNOSIS — I10 Essential (primary) hypertension: Secondary | ICD-10-CM | POA: Diagnosis not present

## 2022-05-10 DIAGNOSIS — F419 Anxiety disorder, unspecified: Secondary | ICD-10-CM | POA: Diagnosis not present

## 2022-05-10 DIAGNOSIS — Z6830 Body mass index (BMI) 30.0-30.9, adult: Secondary | ICD-10-CM | POA: Diagnosis not present

## 2022-05-26 DIAGNOSIS — Z419 Encounter for procedure for purposes other than remedying health state, unspecified: Secondary | ICD-10-CM | POA: Diagnosis not present

## 2022-05-28 DIAGNOSIS — R21 Rash and other nonspecific skin eruption: Secondary | ICD-10-CM | POA: Diagnosis not present

## 2022-05-28 DIAGNOSIS — I10 Essential (primary) hypertension: Secondary | ICD-10-CM | POA: Insufficient documentation

## 2022-05-28 DIAGNOSIS — R8290 Unspecified abnormal findings in urine: Secondary | ICD-10-CM | POA: Diagnosis not present

## 2022-06-21 DIAGNOSIS — R051 Acute cough: Secondary | ICD-10-CM | POA: Diagnosis not present

## 2022-06-21 DIAGNOSIS — Z20822 Contact with and (suspected) exposure to covid-19: Secondary | ICD-10-CM | POA: Diagnosis not present

## 2022-06-26 DIAGNOSIS — Z419 Encounter for procedure for purposes other than remedying health state, unspecified: Secondary | ICD-10-CM | POA: Diagnosis not present

## 2022-07-26 DIAGNOSIS — Z419 Encounter for procedure for purposes other than remedying health state, unspecified: Secondary | ICD-10-CM | POA: Diagnosis not present

## 2022-08-26 DIAGNOSIS — Z419 Encounter for procedure for purposes other than remedying health state, unspecified: Secondary | ICD-10-CM | POA: Diagnosis not present

## 2022-09-11 DIAGNOSIS — I10 Essential (primary) hypertension: Secondary | ICD-10-CM | POA: Diagnosis not present

## 2022-09-26 DIAGNOSIS — Z419 Encounter for procedure for purposes other than remedying health state, unspecified: Secondary | ICD-10-CM | POA: Diagnosis not present

## 2022-10-19 DIAGNOSIS — M25511 Pain in right shoulder: Secondary | ICD-10-CM | POA: Diagnosis not present

## 2022-10-20 DIAGNOSIS — M25551 Pain in right hip: Secondary | ICD-10-CM | POA: Diagnosis not present

## 2022-11-03 DIAGNOSIS — M1611 Unilateral primary osteoarthritis, right hip: Secondary | ICD-10-CM | POA: Diagnosis not present

## 2022-11-03 DIAGNOSIS — M5451 Vertebrogenic low back pain: Secondary | ICD-10-CM | POA: Diagnosis not present

## 2022-11-03 DIAGNOSIS — M5459 Other low back pain: Secondary | ICD-10-CM | POA: Diagnosis not present

## 2022-11-17 DIAGNOSIS — M25511 Pain in right shoulder: Secondary | ICD-10-CM | POA: Diagnosis not present

## 2022-11-23 DIAGNOSIS — M25511 Pain in right shoulder: Secondary | ICD-10-CM | POA: Diagnosis not present

## 2022-11-26 DIAGNOSIS — Z419 Encounter for procedure for purposes other than remedying health state, unspecified: Secondary | ICD-10-CM | POA: Diagnosis not present

## 2022-12-12 DIAGNOSIS — M5459 Other low back pain: Secondary | ICD-10-CM | POA: Diagnosis not present

## 2022-12-14 ENCOUNTER — Telehealth: Payer: Self-pay

## 2022-12-14 NOTE — Telephone Encounter (Signed)
Copied from CRM [id number]. Topic: Clinical - Medication Refill >> Dec 14, 2022 12:04 PM Adelina Mings wrote: Most Recent Primary Care Visit:   Medication: amLODipine (NORVASC) 5 MG tablet  Has the patient contacted their pharmacy? Yes (Agent: If no, request that the patient contact the pharmacy for the refill. If patient does not wish to contact the pharmacy document the reason why and proceed with request.) (Agent: If yes, when and what did the pharmacy advise?) Don't have anymore refills   Is this the correct pharmacy for this prescription? Yes If no, delete pharmacy and type the correct one.  This is the patient's preferred pharmacy:   [id number] [place], [place] - [address] AT [facility] [address] [facility] [address] [place] [address] Phone: [phone] Fax: [phone]   Has the prescription been filled recently? No  Is the patient out of the medication? Yes  Has the patient been seen for an appointment in the last year OR does the patient have an upcoming appointment? No  Can we respond through MyChart? Yes   Agent: Please be advised that Rx refills may take up to 3 business days. We ask that you follow-up with your pharmacy.

## 2022-12-16 ENCOUNTER — Ambulatory Visit: Payer: Self-pay | Admitting: Family Medicine

## 2022-12-26 DIAGNOSIS — Z419 Encounter for procedure for purposes other than remedying health state, unspecified: Secondary | ICD-10-CM | POA: Diagnosis not present

## 2023-01-26 DIAGNOSIS — Z419 Encounter for procedure for purposes other than remedying health state, unspecified: Secondary | ICD-10-CM | POA: Diagnosis not present

## 2023-02-26 DIAGNOSIS — Z419 Encounter for procedure for purposes other than remedying health state, unspecified: Secondary | ICD-10-CM | POA: Diagnosis not present

## 2023-03-26 DIAGNOSIS — Z419 Encounter for procedure for purposes other than remedying health state, unspecified: Secondary | ICD-10-CM | POA: Diagnosis not present

## 2023-05-07 DIAGNOSIS — Z419 Encounter for procedure for purposes other than remedying health state, unspecified: Secondary | ICD-10-CM | POA: Diagnosis not present

## 2023-06-06 DIAGNOSIS — Z419 Encounter for procedure for purposes other than remedying health state, unspecified: Secondary | ICD-10-CM | POA: Diagnosis not present

## 2023-07-07 DIAGNOSIS — Z419 Encounter for procedure for purposes other than remedying health state, unspecified: Secondary | ICD-10-CM | POA: Diagnosis not present

## 2023-08-06 DIAGNOSIS — Z419 Encounter for procedure for purposes other than remedying health state, unspecified: Secondary | ICD-10-CM | POA: Diagnosis not present

## 2023-09-06 DIAGNOSIS — Z419 Encounter for procedure for purposes other than remedying health state, unspecified: Secondary | ICD-10-CM | POA: Diagnosis not present

## 2023-10-03 ENCOUNTER — Ambulatory Visit: Admission: EM | Admit: 2023-10-03 | Discharge: 2023-10-03 | Disposition: A | Discharge status: Home / Self Care

## 2023-10-03 DIAGNOSIS — R0981 Nasal congestion: Secondary | ICD-10-CM

## 2023-10-03 DIAGNOSIS — I10 Essential (primary) hypertension: Secondary | ICD-10-CM | POA: Diagnosis not present

## 2023-10-03 MED ORDER — FLUTICASONE FUROATE 27.5 MCG/SPRAY NA SUSP: 2.0000 | Freq: Every day | NASAL | 0 refills | Status: AC

## 2023-10-03 MED ORDER — AMLODIPINE BESYLATE 5 MG PO TABS: 5.0000 mg | ORAL_TABLET | Freq: Every day | ORAL | 0 refills | Status: DC

## 2023-10-03 NOTE — ED Provider Notes (Signed)
 [facility]    CSN: [id number] Arrival date & time: 10/03/23  1737      History   Chief Complaint Chief Complaint  Patient presents with   Hypertension    HPI Martin Sloan is a 48 y.o. male.   Patient here requesting refill of blood pressure medication.  He states he stopped taking his medication about a year ago and noted that his blood pressure was 144/95 this morning.  He has had other instances where it was slightly elevated.  He states that last night he did have some headache but this has improved somewhat.  He does note that he drank a red bull energy drink yesterday afternoon prior to this.  He is planning to register next-door for primary care.  He denies any chest pain or other concerns.  He also request refill of Flonase if possible.  The history is provided by the patient.  Hypertension Associated symptoms include headaches. Pertinent negatives include no chest pain and no shortness of breath.    Past Medical History:  Diagnosis Date   Hypertension     Patient Active Problem List   Diagnosis Date Noted   Hypertension 05/28/2022    History reviewed. No pertinent surgical history.     Home Medications    Prior to Admission medications   Medication Sig Start Date End Date Taking? Authorizing Provider  nirmatrelvir/ritonavir (PAXLOVID ) 20 x 150 MG & 10 x 100MG  TBPK Take 3 tablets by mouth 2 (two) times daily. 02/01/22  Yes [provider]  amLODipine  (NORVASC ) 5 MG tablet Take 1 tablet (5 mg total) by mouth daily. 10/03/23   Billy Asberry FALCON, PA-C  benzonatate  (TESSALON  PERLES) 100 MG capsule Take 2 capsules (200 mg total) by mouth 3 (three) times daily as needed for cough. 06/25/20   Claudene Ashley JONELLE, NP  cyclobenzaprine  (FLEXERIL ) 10 MG tablet Take 1 tablet (10 mg total) by mouth at bedtime. 09/04/20   White, Shelba JONELLE, NP  doxycycline  (VIBRAMYCIN ) 100 MG capsule Take 1 capsule (100 mg total) by mouth 2 (two) times daily. 03/24/21   White,  Shelba JONELLE, NP  fluticasone  (VERAMYST) 27.5 MCG/SPRAY nasal spray Place 2 sprays into the nose daily. 10/03/23   Billy Asberry FALCON, PA-C  hydrocortisone  (ANUSOL -HC) 25 MG suppository Place 1 suppository (25 mg total) rectally 2 (two) times daily. 04/24/21   Haviland, Julie, MD  ibuprofen  (ADVIL ) 600 MG tablet Take 1 tablet (600 mg total) by mouth every 6 (six) hours as needed. Pain 09/04/20   Teresa Shelba JONELLE, NP  naproxen  (NAPROSYN ) 500 MG tablet Take 1 tablet (500 mg total) by mouth 2 (two) times daily. 06/25/20   Claudene Ashley JONELLE, NP  omeprazole (PRILOSEC) 10 MG capsule Take 10 mg by mouth daily.    [provider]  predniSONE  (DELTASONE ) 20 MG tablet Take 2 tablets (40 mg total) by mouth daily with breakfast. 09/04/20   Teresa Shelba JONELLE, NP  promethazine -dextromethorphan (PROMETHAZINE -DM) 6.25-15 MG/5ML syrup Take 5 mLs by mouth 4 (four) times daily as needed for cough. 06/25/20   Claudene Ashley JONELLE, NP    Family History Family History  Problem Relation Age of Onset   Healthy Mother    Healthy Father     Social History Social History   Tobacco Use   Smoking status: Former    Types: Cigarettes   Smokeless tobacco: Never  Vaping Use   Vaping status: Never Used  Substance Use Topics   Alcohol use: Yes  Comment: Occassionally.   Drug use: No     Allergies   Patient has no known allergies.   Review of Systems Review of Systems  Constitutional:  Negative for chills and fever.  Eyes:  Negative for discharge and redness.  Respiratory:  Negative for shortness of breath.   Cardiovascular:  Negative for chest pain.  Skin:  Negative for color change and wound.  Neurological:  Positive for headaches. Negative for numbness.     Physical Exam Triage Vital Signs ED Triage Vitals  Encounter Vitals Group     BP 10/03/23 1750 126/85     Girls Systolic BP Percentile --      Girls Diastolic BP Percentile --      Boys Systolic BP Percentile --      Boys Diastolic BP Percentile --       Pulse Rate 10/03/23 1750 90     Resp 10/03/23 1750 18     Temp 10/03/23 1750 98.3 F (36.8 C)     Temp Source 10/03/23 1750 Oral     SpO2 10/03/23 1750 96 %     Weight 10/03/23 1747 220 lb (99.8 kg)     Height 10/03/23 1747 5' 10 (1.778 m)     Head Circumference --      Peak Flow --      Pain Score 10/03/23 1740 0     Pain Loc --      Pain Education --      Exclude from Growth Chart --    No data found.  Updated Vital Signs BP 126/85 (BP Location: Left Arm)   Pulse 90   Temp 98.3 F (36.8 C) (Oral)   Resp 18   Ht 5' 10 (1.778 m)   Wt 220 lb (99.8 kg)   SpO2 96%   BMI 31.57 kg/m   Visual Acuity Right Eye Distance:   Left Eye Distance:   Bilateral Distance:    Right Eye Near:   Left Eye Near:    Bilateral Near:     Physical Exam Vitals and nursing note reviewed.  Constitutional:      General: He is not in acute distress.    Appearance: Normal appearance. He is not ill-appearing.  HENT:     Head: Normocephalic and atraumatic.  Eyes:     Conjunctiva/sclera: Conjunctivae normal.  Cardiovascular:     Rate and Rhythm: Normal rate and regular rhythm.  Pulmonary:     Effort: Pulmonary effort is normal. No respiratory distress.     Breath sounds: Normal breath sounds. No wheezing, rhonchi or rales.  Neurological:     Mental Status: He is alert.  Psychiatric:        Mood and Affect: Mood normal.        Behavior: Behavior normal.        Thought Content: Thought content normal.      UC Treatments / Results  Labs (all labs ordered are listed, but only abnormal results are displayed) Labs Reviewed - No data to display  EKG   Radiology No results found.  Procedures Procedures (including critical care time)  Medications Ordered in UC Medications - No data to display  Initial Impression / Assessment and Plan / UC Course  I have reviewed the triage vital signs and the nursing notes.  Pertinent labs & imaging results that were available during my  care of the patient were reviewed by me and considered in my medical decision making (see chart for details).  Discussed blood pressure in office relatively normal, patient request refill of medication regardless.  He denies any side effects from this medication (amlodipine ) in the past.  Advised follow-up with primary care soon as possible.  Patient expressed understanding.  Flonase refilled as requested.   Final Clinical Impressions(s) / UC Diagnoses   Final diagnoses:  Primary hypertension  Nasal congestion   Discharge Instructions   None    ED Prescriptions     Medication Sig Dispense Auth. Provider   amLODipine  (NORVASC ) 5 MG tablet Take 1 tablet (5 mg total) by mouth daily. 60 tablet Billy Stabs F, PA-C   fluticasone  (VERAMYST) 27.5 MCG/SPRAY nasal spray Place 2 sprays into the nose daily. 10 g Billy Stabs FALCON, PA-C      PDMP not reviewed this encounter.   Billy Stabs FALCON, PA-C 10/09/23 [id number]

## 2023-10-03 NOTE — ED Triage Notes (Signed)
 I am here for my high blood pressure, I stopped taking my medicine about a year ago, my most recent BP was 144/95 this morning. I had a ha last night, pain in the left side of my neck which has improved. I did drink a red bull energy drink yesterday afternoon. I need to register with a PCP (next door) to you.

## 2023-10-07 DIAGNOSIS — Z419 Encounter for procedure for purposes other than remedying health state, unspecified: Secondary | ICD-10-CM | POA: Diagnosis not present

## 2023-10-19 ENCOUNTER — Encounter: Payer: Self-pay | Admitting: Family Medicine

## 2023-10-19 NOTE — Telephone Encounter (Signed)
Pt has never been seen at our office.

## 2023-11-16 ENCOUNTER — Emergency Department (HOSPITAL_COMMUNITY)
Admission: EM | Admit: 2023-11-16 | Discharge: 2023-11-17 | Discharge status: Against Medical Advice | Attending: Emergency Medicine | Admitting: Emergency Medicine

## 2023-11-16 ENCOUNTER — Encounter (HOSPITAL_COMMUNITY): Payer: Self-pay | Admitting: Emergency Medicine

## 2023-11-16 ENCOUNTER — Other Ambulatory Visit: Payer: Self-pay

## 2023-11-16 DIAGNOSIS — Z5321 Procedure and treatment not carried out due to patient leaving prior to being seen by health care provider: Secondary | ICD-10-CM | POA: Insufficient documentation

## 2023-11-16 DIAGNOSIS — L509 Urticaria, unspecified: Secondary | ICD-10-CM | POA: Insufficient documentation

## 2023-11-16 NOTE — ED Triage Notes (Signed)
 Patient reports generalized hives for the past 3 days. States he has taken Benadryl and applied corticosteroid ointment without relief. Denies chest pain or shortness of breath.

## 2023-11-17 ENCOUNTER — Ambulatory Visit
Admission: EM | Admit: 2023-11-17 | Discharge: 2023-11-17 | Disposition: A | Discharge status: Home / Self Care | Attending: Family Medicine | Admitting: Family Medicine

## 2023-11-17 ENCOUNTER — Encounter: Payer: Self-pay | Admitting: Emergency Medicine

## 2023-11-17 DIAGNOSIS — L259 Unspecified contact dermatitis, unspecified cause: Secondary | ICD-10-CM | POA: Diagnosis not present

## 2023-11-17 MED ORDER — CETIRIZINE HCL 10 MG PO TABS
10.0000 mg | ORAL_TABLET | Freq: Every day | ORAL | 2 refills | Status: AC
Start: 2023-11-17 — End: ?

## 2023-11-17 MED ORDER — METHYLPREDNISOLONE SODIUM SUCC 125 MG IJ SOLR
60.0000 mg | Freq: Once | INTRAMUSCULAR | Status: AC
  Administered 2023-11-17: 60 mg via INTRAMUSCULAR

## 2023-11-17 MED ORDER — FAMOTIDINE 20 MG PO TABS: 20.0000 mg | ORAL_TABLET | Freq: Every evening | ORAL | 0 refills | Status: AC

## 2023-11-17 NOTE — ED Provider Notes (Signed)
 [facility]    CSN: [id number] Arrival date & time: 11/17/23  0802     History   Chief Complaint Chief Complaint  Patient presents with   Rash    HPI Martin Sloan is a 48 y.o. male.  Here with 10 day history of rash Itching, not painful Started on legs but then moved to chest and back Thinks it looks like hives Started after he was outside mowing the lawn Not sure of any specific allergen exposure, no new products or medications Not having oral swelling or respiratory symptoms  Has tried topical hydrocortisone  and PO benadryl with temporary relief   Past Medical History:  Diagnosis Date   Hypertension     Patient Active Problem List   Diagnosis Date Noted   Hypertension 05/28/2022    History reviewed. No pertinent surgical history.     Home Medications    Prior to Admission medications   Medication Sig Start Date End Date Taking? Authorizing Provider  cetirizine (ZYRTEC ALLERGY) 10 MG tablet Take 1 tablet (10 mg total) by mouth daily. 11/17/23  Yes Cliffie Gingras, Asberry, PA-C  famotidine (PEPCID) 20 MG tablet Take 1 tablet (20 mg total) by mouth at bedtime. 11/17/23  Yes Jarielys Girardot, Asberry, PA-C  amLODipine  (NORVASC ) 5 MG tablet Take 1 tablet (5 mg total) by mouth daily. 10/03/23   Billy Asberry FALCON, PA-C  fluticasone  (VERAMYST) 27.5 MCG/SPRAY nasal spray Place 2 sprays into the nose daily. 10/03/23   Billy Asberry FALCON, PA-C  omeprazole (PRILOSEC) 10 MG capsule Take 10 mg by mouth daily.    [provider]    Family History Family History  Problem Relation Age of Onset   Healthy Mother    Healthy Father     Social History Social History   Tobacco Use   Smoking status: Former    Types: Cigarettes    Passive exposure: Past   Smokeless tobacco: Never  Vaping Use   Vaping status: Never Used  Substance Use Topics   Alcohol use: Yes    Comment: Occassionally.   Drug use: No     Allergies   Patient has no known allergies.   Review of  Systems Review of Systems  Skin:  Positive for rash.  As per HPI   Physical Exam Triage Vital Signs ED Triage Vitals [11/17/23 0822]  Encounter Vitals Group     BP      Girls Systolic BP Percentile      Girls Diastolic BP Percentile      Boys Systolic BP Percentile      Boys Diastolic BP Percentile      Pulse      Resp      Temp      Temp src      SpO2      Weight 220 lb 0.3 oz (99.8 kg)     Height      Head Circumference      Peak Flow      Pain Score 0     Pain Loc      Pain Education      Exclude from Growth Chart    No data found.  Updated Vital Signs BP 139/89 (BP Location: Left Arm)   Pulse 81   Temp 98.2 F (36.8 C) (Oral)   Resp 16   Wt 220 lb 0.3 oz (99.8 kg)   SpO2 98%   BMI 31.57 kg/m    Physical Exam Vitals and nursing note reviewed.  Constitutional:  General: He is not in acute distress.    Appearance: Normal appearance.  HENT:     Mouth/Throat:     Mouth: Mucous membranes are moist.     Pharynx: Oropharynx is clear. No posterior oropharyngeal erythema.     Comments: No oral swelling Eyes:     Conjunctiva/sclera: Conjunctivae normal.     Pupils: Pupils are equal, round, and reactive to light.  Cardiovascular:     Rate and Rhythm: Normal rate and regular rhythm.     Pulses: Normal pulses.     Heart sounds: Normal heart sounds.  Pulmonary:     Effort: Pulmonary effort is normal. No respiratory distress.     Breath sounds: Normal breath sounds. No wheezing.  Musculoskeletal:        General: Normal range of motion.     Cervical back: Normal range of motion.  Skin:    Findings: Rash present.     Comments: Faint macular rash on the legs and abdomen. There are some hives on the low back and shoulders. No rash on arms, palms, face  Neurological:     Mental Status: He is alert and oriented to person, place, and time.     UC Treatments / Results  Labs (all labs ordered are listed, but only abnormal results are displayed) Labs  Reviewed - No data to display  EKG  Radiology No results found.  Procedures Procedures   Medications Ordered in UC Medications  methylPREDNISolone sodium succinate (SOLU-MEDROL) 125 mg/2 mL injection 60 mg (has no administration in time range)    Initial Impression / Assessment and Plan / UC Course  I have reviewed the triage vital signs and the nursing notes.  Pertinent labs & imaging results that were available during my care of the patient were reviewed by me and considered in my medical decision making (see chart for details).  Stable vitals Clear lungs, well appearing Rash likely contact dermatitis. Reassurance provided. IM steroid in clinic for acute itch relief. Antihistamine regimen at home. Return precautions Agrees to plan, no questions   Final Clinical Impressions(s) / UC Diagnoses   Final diagnoses:  Contact dermatitis, unspecified contact dermatitis type, unspecified trigger     Discharge Instructions      The steroid injection (Solumedrol) can start to work in about 30 minutes to reduce itching  At home please use the following antihistamine regimen: Daily zyrtec (cetirizine) 10 mg Nighty pepcid (famotidine) 20 mg and benadryl (diphenhydramine) 25 mg Use for the next 7 days in a row to keep itch away  Your rash is not contagious! It is likely an allergic skin reaction to something you were exposed to when cutting the grass. Please return if needed.      ED Prescriptions     Medication Sig Dispense Auth. Provider   cetirizine (ZYRTEC ALLERGY) 10 MG tablet Take 1 tablet (10 mg total) by mouth daily. 30 tablet Seven Marengo, PA-C   famotidine (PEPCID) 20 MG tablet Take 1 tablet (20 mg total) by mouth at bedtime. 30 tablet Mumtaz Lovins, Asberry, PA-C      PDMP not reviewed this encounter.   Jeryl Asberry, PA-C 11/17/23 [phone]

## 2023-11-17 NOTE — ED Notes (Signed)
 Patient decided to leave. Notified staff at front.

## 2023-11-17 NOTE — ED Triage Notes (Signed)
 Pt presents c/o rash x 10 days total. Pt states,  Last week my leg started itching. This week my back started itching and I have what look like hives. Now I have the same rash on my chest.  Pt denies any changes in day to day products or eating anything different that would cause an allergic reaction.

## 2023-11-17 NOTE — Discharge Instructions (Addendum)
 The steroid injection (Solumedrol) can start to work in about 30 minutes to reduce itching  At home please use the following antihistamine regimen: Daily zyrtec (cetirizine) 10 mg Nighty pepcid (famotidine) 20 mg and benadryl (diphenhydramine) 25 mg Use for the next 7 days in a row to keep itch away  Your rash is not contagious! It is likely an allergic skin reaction to something you were exposed to when cutting the grass. Please return if needed.

## 2023-11-29 ENCOUNTER — Encounter: Payer: Self-pay | Admitting: Family

## 2023-11-29 ENCOUNTER — Ambulatory Visit: Admitting: Family

## 2023-11-29 VITALS — BP 147/91 | HR 86 | Temp 98.5°F | Resp 16 | Ht 70.0 in | Wt 217.2 lb

## 2023-11-29 DIAGNOSIS — T7840XD Allergy, unspecified, subsequent encounter: Secondary | ICD-10-CM | POA: Diagnosis not present

## 2023-11-29 DIAGNOSIS — R142 Eructation: Secondary | ICD-10-CM

## 2023-11-29 DIAGNOSIS — K219 Gastro-esophageal reflux disease without esophagitis: Secondary | ICD-10-CM

## 2023-11-29 DIAGNOSIS — I10 Essential (primary) hypertension: Secondary | ICD-10-CM | POA: Diagnosis not present

## 2023-11-29 DIAGNOSIS — Z7689 Persons encountering health services in other specified circumstances: Secondary | ICD-10-CM

## 2023-11-29 MED ORDER — AMLODIPINE BESYLATE 10 MG PO TABS: 10.0000 mg | ORAL_TABLET | Freq: Every day | ORAL | 0 refills | Status: DC

## 2023-11-29 NOTE — Progress Notes (Signed)
 Hurt burn, patient wants allergy testing,  medication refill

## 2023-11-29 NOTE — Progress Notes (Signed)
 Subjective:    Martin Sloan - 48 y.o. male MRN [id number]  Date of birth: 1975-08-17  HPI  Martin Sloan is to establish care. He is accompanied by his significant other.   Current issues and/or concerns: - High blood pressure. Doing well on Amlodipine , no issues/concerns. He is trying to limit salt intake. He does not exercise outside of his normal routine. He does not check blood pressure outside of office. He does not complain of red flag symptoms such as but not limited to chest pain, shortness of breath, worst headache of life, nausea/vomiting.  - Allergies. States in the past he was told he is allergic to grass and prescribed medications which didn't help. Would like referral to specialist.  - Acid reflux and belching. Denies red flag symptoms. States prescribed and over-the-counter medications have not helped.   ROS per HPI    Health Maintenance:  Health Maintenance Due  Topic Date Due   Hepatitis C Screening  Never done   Colonoscopy  Never done     Past Medical History: Patient Active Problem List   Diagnosis Date Noted   Hypertension 05/28/2022      Social History   reports that he has quit smoking. His smoking use included cigarettes. He has been exposed to tobacco smoke. He has never used smokeless tobacco. He reports current alcohol use. He reports that he does not use drugs.   Family History  family history includes Healthy in his father and mother.   Medications: reviewed and updated   Objective:   Physical Exam BP (!) 147/91   Pulse 86   Temp 98.5 F (36.9 C) (Oral)   Resp 16   Ht 5' 10 (1.778 m)   Wt 217 lb 3.2 oz (98.5 kg)   SpO2 95%   BMI 31.16 kg/m   Physical Exam HENT:     Head: Normocephalic and atraumatic.     Nose: Nose normal.     Mouth/Throat:     Mouth: Mucous membranes are moist.     Pharynx: Oropharynx is clear.  Eyes:     Extraocular Movements: Extraocular movements intact.     Conjunctiva/sclera: Conjunctivae normal.      Pupils: Pupils are equal, round, and reactive to light.  Cardiovascular:     Rate and Rhythm: Normal rate and regular rhythm.     Pulses: Normal pulses.     Heart sounds: Normal heart sounds.  Pulmonary:     Effort: Pulmonary effort is normal.     Breath sounds: Normal breath sounds.  Musculoskeletal:        General: Normal range of motion.     Cervical back: Normal range of motion and neck supple.  Neurological:     General: No focal deficit present.     Mental Status: He is alert and oriented to person, place, and time.  Psychiatric:        Mood and Affect: Mood normal.        Behavior: Behavior normal.        Assessment & Plan:  1. Encounter to establish care (Primary) - Patient presents today to establish care. During the interim follow-up with primary provider as scheduled.  - Return for annual physical examination, labs, and health maintenance. Arrive fasting meaning having no food for at least 8 hours prior to appointment. You may have only water or black coffee. Please take scheduled medications as normal.  2. Primary hypertension - Blood pressure not at goal during today's visit.  Patient asymptomatic without chest pressure, chest pain, palpitations, shortness of breath, worst headache of life, and any additional red flag symptoms. - Increase Amlodipine  from 5 mg to 10 mg as prescribed.  - Routine screening.  - Counseled on blood pressure goal of less than 130/80, low-sodium, DASH diet, medication compliance, and 150 minutes of moderate intensity exercise per week as tolerated. Counseled on medication adherence and adverse effects. - Follow-up with primary provider in 4 weeks or sooner if needed.  - amLODipine  (NORVASC ) 10 MG tablet; Take 1 tablet (10 mg total) by mouth daily.  Dispense: 90 tablet; Refill: 0 - Basic Metabolic Panel  3. Allergy, subsequent encounter - Routine screening.  - Referral to Allergy for evaluation/management. - Follow-up with primary  provider as scheduled.  - Ambulatory referral to Allergy - Allergens(6) Grasses  4. Gastroesophageal reflux disease, unspecified whether esophagitis present 5. Belching - Patient declined pharmacological therapy.  - Referral to Gastroenterology for evaluation/management.  - Follow-up with primary provider as scheduled.  - Ambulatory referral to Gastroenterology    Patient was given clear instructions to go to Emergency Department or return to medical center if symptoms don't improve, worsen, or new problems develop.The patient verbalized understanding.  I discussed the assessment and treatment plan with the patient. The patient was provided an opportunity to ask questions and all were answered. The patient agreed with the plan and demonstrated an understanding of the instructions.   The patient was advised to call back or seek an in-person evaluation if the symptoms worsen or if the condition fails to improve as anticipated.    Greig Drones, NP 11/29/2023, 1:36 PM Primary Care at [facility]

## 2023-12-14 ENCOUNTER — Encounter: Payer: Self-pay | Admitting: Family

## 2023-12-28 ENCOUNTER — Encounter: Payer: Self-pay | Admitting: Family

## 2024-01-06 ENCOUNTER — Encounter: Payer: Self-pay | Admitting: Family

## 2024-01-06 ENCOUNTER — Ambulatory Visit (INDEPENDENT_AMBULATORY_CARE_PROVIDER_SITE_OTHER): Admitting: Family

## 2024-01-06 VITALS — BP 139/94 | HR 79 | Temp 98.3°F | Resp 16 | Ht 70.0 in | Wt 217.2 lb

## 2024-01-06 DIAGNOSIS — Z1159 Encounter for screening for other viral diseases: Secondary | ICD-10-CM | POA: Diagnosis not present

## 2024-01-06 DIAGNOSIS — Z13228 Encounter for screening for other metabolic disorders: Secondary | ICD-10-CM

## 2024-01-06 DIAGNOSIS — Z0000 Encounter for general adult medical examination without abnormal findings: Secondary | ICD-10-CM | POA: Diagnosis not present

## 2024-01-06 DIAGNOSIS — Z1329 Encounter for screening for other suspected endocrine disorder: Secondary | ICD-10-CM

## 2024-01-06 DIAGNOSIS — F419 Anxiety disorder, unspecified: Secondary | ICD-10-CM

## 2024-01-06 DIAGNOSIS — Z1211 Encounter for screening for malignant neoplasm of colon: Secondary | ICD-10-CM

## 2024-01-06 DIAGNOSIS — F32A Depression, unspecified: Secondary | ICD-10-CM | POA: Diagnosis not present

## 2024-01-06 DIAGNOSIS — Z13220 Encounter for screening for lipoid disorders: Secondary | ICD-10-CM

## 2024-01-06 DIAGNOSIS — I10 Essential (primary) hypertension: Secondary | ICD-10-CM

## 2024-01-06 DIAGNOSIS — Z130 Encounter for screening for diseases of the blood and blood-forming organs and certain disorders involving the immune mechanism: Secondary | ICD-10-CM | POA: Diagnosis not present

## 2024-01-06 DIAGNOSIS — Z131 Encounter for screening for diabetes mellitus: Secondary | ICD-10-CM | POA: Diagnosis not present

## 2024-01-06 MED ORDER — FLUOXETINE HCL 10 MG PO CAPS: 10.0000 mg | ORAL_CAPSULE | Freq: Every day | ORAL | 0 refills | Status: AC

## 2024-01-06 MED ORDER — AMLODIPINE BESYLATE 10 MG PO TABS: 10.0000 mg | ORAL_TABLET | Freq: Every day | ORAL | 0 refills | Status: DC

## 2024-01-06 NOTE — Progress Notes (Signed)
 Patient ID: Martin Sloan, male    DOB: 06/06/1975  MRN: [id number]  CC: Annual Exam   Subjective: Martin Sloan is a 48 y.o. male who presents for annual exam. He is accompanied by his significant other.   His concerns today include:  - Due for colon cancer screening.  - Doing well on Amlodipine , no issues/concerns. He does not limit salt intake. He does not check blood pressure outside of office. He does not complain of red flag symptoms such as but not limited to chest pain, shortness of breath, worst headache of life, nausea/vomiting.  - Anxiety depression. Would like to try medication to see if this helps. He denies thoughts of self-harm, suicidal ideations, homicidal ideations.  Patient Active Problem List   Diagnosis Date Noted   Hypertension 05/28/2022     Medications Ordered Prior to Encounter[1]  Allergies[2]  Social History   Socioeconomic History   Marital status: Single    Spouse name: Not on file   Number of children: Not on file   Years of education: Not on file   Highest education level: Bachelor's degree (e.g., BA, AB, BS)  Occupational History   Not on file  Tobacco Use   Smoking status: Former    Types: Cigarettes    Passive exposure: Past   Smokeless tobacco: Never  Vaping Use   Vaping status: Never Used  Substance and Sexual Activity   Alcohol use: Yes    Comment: Occassionally.   Drug use: No   Sexual activity: Yes  Other Topics Concern   Not on file  Social History Narrative   Not on file   Social Drivers of Health   Tobacco Use: Medium Risk (01/06/2024)   Patient History    Smoking Tobacco Use: Former    Smokeless Tobacco Use: Never    Passive Exposure: Past  Physicist, Medical Strain: Patient Declined (01/03/2024)   Overall Financial Resource Strain (CARDIA)    Difficulty of Paying Living Expenses: Patient declined  Food Insecurity: Patient Declined (01/03/2024)   Epic    Worried About Programme Researcher, Broadcasting/film/video in the Last Year:  Patient declined    Barista in the Last Year: Patient declined  Transportation Needs: No Transportation Needs (01/03/2024)   Epic    Lack of Transportation (Medical): No    Lack of Transportation (Non-Medical): No  Physical Activity: Unknown (01/03/2024)   Exercise Vital Sign    Days of Exercise per Week: Patient declined    Minutes of Exercise per Session: Not on file  Stress: No Stress Concern Present (01/03/2024)   [employer] of Occupational Health - Occupational Stress Questionnaire    Feeling of Stress: Only a little  Social Connections: Moderately Isolated (01/03/2024)   Social Connection and Isolation Panel    Frequency of Communication with Friends and Family: More than three times a week    Frequency of Social Gatherings with Friends and Family: Patient declined    Attends Religious Services: Patient declined    Active Member of Clubs or Organizations: No    Attends Engineer, Structural: Not on file    Marital Status: Living with partner  Intimate Partner Violence: Not At Risk (11/29/2023)   Epic    Fear of Current or Ex-Partner: No    Emotionally Abused: No    Physically Abused: No    Sexually Abused: No  Depression (PHQ2-9): Low Risk (01/06/2024)   Depression (PHQ2-9)    PHQ-2 Score: 0  Alcohol Screen:  Low Risk (01/03/2024)   Alcohol Screen    Last Alcohol Screening Score (AUDIT): 5  Housing: Unknown (01/03/2024)   Epic    Unable to Pay for Housing in the Last Year: No    Number of Times Moved in the Last Year: Not on file    Homeless in the Last Year: No  Utilities: Not At Risk (11/29/2023)   Epic    Threatened with loss of utilities: No  Health Literacy: Adequate Health Literacy (11/29/2023)   B1300 Health Literacy    Frequency of need for help with medical instructions: Never    Family History  Problem Relation Age of Onset   Healthy Mother    Healthy Father     History reviewed. No pertinent surgical history.  ROS: Review of  Systems Negative except as stated above  PHYSICAL EXAM: BP (!) 139/94   Pulse 79   Temp 98.3 F (36.8 C) (Oral)   Resp 16   Ht 5' 10 (1.778 m)   Wt 217 lb 3.2 oz (98.5 kg)   SpO2 97%   BMI 31.16 kg/m   Physical Exam HENT:     Head: Normocephalic and atraumatic.     Right Ear: Tympanic membrane, ear canal and external ear normal.     Left Ear: Tympanic membrane, ear canal and external ear normal.     Nose: Nose normal.     Mouth/Throat:     Mouth: Mucous membranes are moist.     Pharynx: Oropharynx is clear.  Eyes:     Extraocular Movements: Extraocular movements intact.     Conjunctiva/sclera: Conjunctivae normal.     Pupils: Pupils are equal, round, and reactive to light.  Neck:     Thyroid: No thyroid mass, thyromegaly or thyroid tenderness.  Cardiovascular:     Rate and Rhythm: Normal rate and regular rhythm.     Pulses: Normal pulses.     Heart sounds: Normal heart sounds.  Pulmonary:     Effort: Pulmonary effort is normal.     Breath sounds: Normal breath sounds.  Abdominal:     General: Bowel sounds are normal.     Palpations: Abdomen is soft.  Genitourinary:    Comments: Patient declined.  Musculoskeletal:        General: Normal range of motion.     Right shoulder: Normal.     Left shoulder: Normal.     Right upper arm: Normal.     Left upper arm: Normal.     Right elbow: Normal.     Left elbow: Normal.     Right forearm: Normal.     Left forearm: Normal.     Right wrist: Normal.     Left wrist: Normal.     Right hand: Normal.     Left hand: Normal.     Cervical back: Normal, normal range of motion and neck supple.     Thoracic back: Normal.     Lumbar back: Normal.     Right hip: Normal.     Left hip: Normal.     Right upper leg: Normal.     Left upper leg: Normal.     Right knee: Normal.     Left knee: Normal.     Right lower leg: Normal.     Left lower leg: Normal.     Right ankle: Normal.     Left ankle: Normal.     Right foot: Normal.      Left foot: Normal.  Skin:  General: Skin is warm and dry.     Capillary Refill: Capillary refill takes less than 2 seconds.  Neurological:     General: No focal deficit present.     Mental Status: He is alert and oriented to person, place, and time.  Psychiatric:        Mood and Affect: Mood normal.        Behavior: Behavior normal.     ASSESSMENT AND PLAN: 1. Annual physical exam (Primary) - Counseled on 150 minutes of exercise per week as tolerated, healthy eating (including decreased daily intake of saturated fats, cholesterol, added sugars, sodium), STI prevention, and routine healthcare maintenance.  2. Screening for metabolic disorder - Routine screening.  - CMP14+EGFR  3. Screening for deficiency anemia - Routine screening.  - CBC  4. Diabetes mellitus screening - Routine screening.  - Hemoglobin A1c  5. Screening cholesterol level - Routine screening.  - Lipid panel  6. Thyroid disorder screen - Routine screening.  - TSH  7. Need for hepatitis C screening test - Routine screening.  - Hepatitis C antibody  8. Colon cancer screening - Referral to Gastroenterology for colon cancer screening by colonoscopy. - Ambulatory referral to Gastroenterology  9. Primary hypertension - Blood pressure not at goal during today's visit. Patient asymptomatic without chest pressure, chest pain, palpitations, shortness of breath, worst headache of life, and any additional red flag symptoms. - Patient declined pharmacological adjustments.  - Continue Amlodipine  as prescribed.  - Counseled on blood pressure goal of less than 130/80, low-sodium, DASH diet, medication compliance, and 150 minutes of moderate intensity exercise per week as tolerated. Counseled on medication adherence and adverse effects. - Follow-up with primary provider in 4 weeks or sooner if needed. - amLODipine  (NORVASC ) 10 MG tablet; Take 1 tablet (10 mg total) by mouth daily.  Dispense: 90 tablet; Refill:  0  10. Anxiety and depression - Patient denies thoughts of self-harm, suicidal ideations, homicidal ideations. - Fluoxetine as prescribed. Counseled on medication adherence/adverse effects.  - Patient declined referral to Psychiatry.  - Follow-up with primary provider in 4 weeks or sooner if needed. - FLUoxetine (PROZAC) 10 MG capsule; Take 1 capsule (10 mg total) by mouth daily.  Dispense: 90 capsule; Refill: 0  Patient was given the opportunity to ask questions.  Patient verbalized understanding of the plan and was able to repeat key elements of the plan. Patient was given clear instructions to go to Emergency Department or return to medical center if symptoms don't improve, worsen, or new problems develop.The patient verbalized understanding.   Orders Placed This Encounter  Procedures   Hepatitis C antibody   CBC   Lipid panel   CMP14+EGFR   Hemoglobin A1c   TSH   Ambulatory referral to Gastroenterology     Requested Prescriptions   Signed Prescriptions Disp Refills   FLUoxetine (PROZAC) 10 MG capsule 90 capsule 0    Sig: Take 1 capsule (10 mg total) by mouth daily.   amLODipine  (NORVASC ) 10 MG tablet 90 tablet 0    Sig: Take 1 tablet (10 mg total) by mouth daily.    Return in about 1 year (around 01/05/2025) for Physical per patient preference and 4 weeks or next available chronic conditions.  Greig JINNY Chute, NP      [1]  Current Outpatient Medications on File Prior to Visit  Medication Sig Dispense Refill   fluticasone  (VERAMYST) 27.5 MCG/SPRAY nasal spray Place 2 sprays into the nose daily. 10 g 0  amLODipine  (NORVASC ) 5 MG tablet Take 1 tablet (5 mg total) by mouth daily. (Patient not taking: Reported on 01/06/2024) 60 tablet 0   cetirizine  (ZYRTEC  ALLERGY) 10 MG tablet Take 1 tablet (10 mg total) by mouth daily. (Patient not taking: Reported on 01/06/2024) 30 tablet 2   famotidine  (PEPCID ) 20 MG tablet Take 1 tablet (20 mg total) by mouth at bedtime. (Patient not  taking: Reported on 01/06/2024) 30 tablet 0   omeprazole (PRILOSEC) 10 MG capsule Take 10 mg by mouth daily. (Patient not taking: Reported on 01/06/2024)     No current facility-administered medications on file prior to visit.  [2] No Known Allergies

## 2024-01-06 NOTE — Progress Notes (Signed)
 Patient scored a 8 on the GAD-7

## 2024-01-07 LAB — LIPID PANEL
Chol/HDL Ratio: 1.8 ratio (ref 0.0–5.0)
Cholesterol, Total: 191 mg/dL (ref 100–199)
HDL: 106 mg/dL (ref >39)
LDL Chol Calc (NIH): 73 mg/dL (ref 0–99)
Triglycerides: 62 mg/dL (ref 0–149)
VLDL Cholesterol Cal: 12 mg/dL (ref 5–40)

## 2024-01-07 LAB — CMP14+EGFR
ALT: 24 IU/L (ref 0–44)
AST: 26 IU/L (ref 0–40)
Albumin: 4.7 g/dL (ref 4.1–5.1)
Alkaline Phosphatase: 71 IU/L (ref 47–123)
BUN/Creatinine Ratio: 10 (ref 9–20)
BUN: 9 mg/dL (ref 6–24)
Bilirubin Total: 0.4 mg/dL (ref 0.0–1.2)
CO2: 21 mmol/L (ref 20–29)
Calcium: 10 mg/dL (ref 8.7–10.2)
Chloride: 99 mmol/L (ref 96–106)
Creatinine, Ser: 0.87 mg/dL (ref 0.76–1.27)
Globulin, Total: 2.8 g/dL (ref 1.5–4.5)
Glucose: 90 mg/dL (ref 70–99)
Potassium: 4.6 mmol/L (ref 3.5–5.2)
Sodium: 138 mmol/L (ref 134–144)
Total Protein: 7.5 g/dL (ref 6.0–8.5)
eGFR: 106 mL/min/1.73 (ref >59)

## 2024-01-07 LAB — CBC
Hematocrit: 48.2 % (ref 37.5–51.0)
Hemoglobin: 15.8 g/dL (ref 13.0–17.7)
MCH: 28.9 pg (ref 26.6–33.0)
MCHC: 32.8 g/dL (ref 31.5–35.7)
MCV: 88 fL (ref 79–97)
Platelets: 272 x10E3/uL (ref 150–450)
RBC: 5.47 x10E6/uL (ref 4.14–5.80)
RDW: 13 % (ref 11.6–15.4)
WBC: 4.4 x10E3/uL (ref 3.4–10.8)

## 2024-01-07 LAB — HEMOGLOBIN A1C
Est. average glucose Bld gHb Est-mCnc: 103 mg/dL
Hgb A1c MFr Bld: 5.2 % (ref 4.8–5.6)

## 2024-01-07 LAB — HEPATITIS C ANTIBODY: Hep C Virus Ab: NONREACTIVE

## 2024-01-07 LAB — TSH: TSH: 1.43 u[IU]/mL (ref 0.450–4.500)

## 2024-01-09 ENCOUNTER — Ambulatory Visit: Payer: Self-pay | Admitting: Family

## 2024-01-17 ENCOUNTER — Ambulatory Visit: Admitting: Allergy & Immunology

## 2024-02-12 ENCOUNTER — Encounter: Payer: Self-pay | Admitting: Family

## 2024-02-13 ENCOUNTER — Other Ambulatory Visit: Payer: Self-pay

## 2024-02-13 DIAGNOSIS — I10 Essential (primary) hypertension: Secondary | ICD-10-CM

## 2024-02-13 MED ORDER — AMLODIPINE BESYLATE 10 MG PO TABS: 10.0000 mg | ORAL_TABLET | Freq: Every day | ORAL | 0 refills | Status: AC
